# Patient Record
Sex: Female | Born: 1965 | Race: Black or African American | Hispanic: No
Health system: Southern US, Community
[De-identification: ages and names within clinical notes are randomized; demographics above are authoritative.]

## PROBLEM LIST (undated history)

## (undated) ENCOUNTER — Emergency Department (HOSPITAL_COMMUNITY): Discharge status: Absent without leave | Payer: Self-pay

---

## 1998-09-25 ENCOUNTER — Emergency Department (HOSPITAL_COMMUNITY): Admission: EM | Admit: 1998-09-25 | Discharge: 1998-09-25 | Payer: Self-pay | Admitting: Emergency Medicine

## 1998-10-02 ENCOUNTER — Inpatient Hospital Stay (HOSPITAL_COMMUNITY): Admission: EM | Admit: 1998-10-02 | Discharge: 1998-10-04 | Payer: Self-pay | Admitting: Emergency Medicine

## 2000-02-05 ENCOUNTER — Emergency Department (HOSPITAL_COMMUNITY): Admission: EM | Admit: 2000-02-05 | Discharge: 2000-02-05 | Payer: Self-pay | Admitting: Emergency Medicine

## 2000-02-05 ENCOUNTER — Encounter: Payer: Self-pay | Admitting: Emergency Medicine

## 2001-08-06 ENCOUNTER — Emergency Department (HOSPITAL_COMMUNITY): Admission: EM | Admit: 2001-08-06 | Discharge: 2001-08-06 | Payer: Self-pay | Admitting: Emergency Medicine

## 2002-03-28 ENCOUNTER — Inpatient Hospital Stay (HOSPITAL_COMMUNITY): Admission: AD | Admit: 2002-03-28 | Discharge: 2002-03-31 | Payer: Self-pay | Admitting: *Deleted

## 2002-03-28 ENCOUNTER — Encounter (INDEPENDENT_AMBULATORY_CARE_PROVIDER_SITE_OTHER): Payer: Self-pay

## 2002-03-29 ENCOUNTER — Encounter: Payer: Self-pay | Admitting: Obstetrics and Gynecology

## 2003-03-17 ENCOUNTER — Emergency Department (HOSPITAL_COMMUNITY): Admission: EM | Admit: 2003-03-17 | Discharge: 2003-03-17 | Payer: Self-pay | Admitting: Emergency Medicine

## 2003-03-22 ENCOUNTER — Encounter: Admission: RE | Admit: 2003-03-22 | Discharge: 2003-03-22 | Payer: Self-pay | Admitting: Internal Medicine

## 2003-04-25 ENCOUNTER — Emergency Department (HOSPITAL_COMMUNITY): Admission: EM | Admit: 2003-04-25 | Discharge: 2003-04-25 | Payer: Self-pay | Admitting: Emergency Medicine

## 2004-07-25 ENCOUNTER — Emergency Department (HOSPITAL_COMMUNITY): Admission: EM | Admit: 2004-07-25 | Discharge: 2004-07-25 | Payer: Self-pay | Admitting: Emergency Medicine

## 2005-03-15 ENCOUNTER — Emergency Department (HOSPITAL_COMMUNITY): Admission: EM | Admit: 2005-03-15 | Discharge: 2005-03-15 | Payer: Self-pay | Admitting: Family Medicine

## 2006-07-20 ENCOUNTER — Emergency Department (HOSPITAL_COMMUNITY): Admission: EM | Admit: 2006-07-20 | Discharge: 2006-07-20 | Payer: Self-pay | Admitting: Emergency Medicine

## 2007-10-29 ENCOUNTER — Emergency Department (HOSPITAL_COMMUNITY): Admission: EM | Admit: 2007-10-29 | Discharge: 2007-10-29 | Payer: Self-pay | Admitting: Emergency Medicine

## 2009-06-26 ENCOUNTER — Emergency Department (HOSPITAL_COMMUNITY): Admission: EM | Admit: 2009-06-26 | Discharge: 2009-06-26 | Payer: Self-pay | Admitting: Family Medicine

## 2009-07-14 ENCOUNTER — Ambulatory Visit: Payer: Self-pay | Admitting: Advanced Practice Midwife

## 2009-07-14 ENCOUNTER — Inpatient Hospital Stay (HOSPITAL_COMMUNITY): Admission: AD | Admit: 2009-07-14 | Discharge: 2009-07-15 | Payer: Self-pay | Admitting: Obstetrics and Gynecology

## 2009-10-22 ENCOUNTER — Emergency Department (HOSPITAL_COMMUNITY): Admission: EM | Admit: 2009-10-22 | Discharge: 2009-10-22 | Payer: Self-pay | Admitting: Emergency Medicine

## 2010-02-01 ENCOUNTER — Emergency Department (HOSPITAL_COMMUNITY)
Admission: EM | Admit: 2010-02-01 | Discharge: 2010-02-01 | Payer: Self-pay | Source: Home / Self Care | Admitting: Emergency Medicine

## 2010-04-14 LAB — POCT PREGNANCY, URINE: Preg Test, Ur: NEGATIVE

## 2010-04-14 LAB — URINE MICROSCOPIC-ADD ON

## 2010-04-14 LAB — CBC
HCT: 37.2 % (ref 36.0–46.0)
Hemoglobin: 12.6 g/dL (ref 12.0–15.0)
MCH: 30.5 pg (ref 26.0–34.0)
MCHC: 33.9 g/dL (ref 30.0–36.0)
MCV: 90.1 fL (ref 78.0–100.0)
Platelets: 176 10*3/uL (ref 150–400)
RBC: 4.13 MIL/uL (ref 3.87–5.11)
RDW: 12.2 % (ref 11.5–15.5)
WBC: 9.8 10*3/uL (ref 4.0–10.5)

## 2010-04-14 LAB — URINALYSIS, ROUTINE W REFLEX MICROSCOPIC
Bilirubin Urine: NEGATIVE
Glucose, UA: NEGATIVE mg/dL
Ketones, ur: 15 mg/dL — AB
Nitrite: NEGATIVE
Protein, ur: 100 mg/dL — AB
Specific Gravity, Urine: 1.014 (ref 1.005–1.030)
Urobilinogen, UA: 1 mg/dL (ref 0.0–1.0)
pH: 8 (ref 5.0–8.0)

## 2010-04-14 LAB — HEPATIC FUNCTION PANEL
ALT: 19 U/L (ref 0–35)
AST: 25 U/L (ref 0–37)
Albumin: 3.7 g/dL (ref 3.5–5.2)
Alkaline Phosphatase: 73 U/L (ref 39–117)
Bilirubin, Direct: 0.2 mg/dL (ref 0.0–0.3)
Indirect Bilirubin: 1.1 mg/dL — ABNORMAL HIGH (ref 0.3–0.9)
Total Bilirubin: 1.3 mg/dL — ABNORMAL HIGH (ref 0.3–1.2)
Total Protein: 6.8 g/dL (ref 6.0–8.3)

## 2010-04-14 LAB — BASIC METABOLIC PANEL
BUN: 8 mg/dL (ref 6–23)
CO2: 25 mEq/L (ref 19–32)
Calcium: 9.1 mg/dL (ref 8.4–10.5)
Chloride: 104 mEq/L (ref 96–112)
Creatinine, Ser: 0.95 mg/dL (ref 0.4–1.2)
GFR calc Af Amer: >60 mL/min (ref >60)
GFR calc non Af Amer: >60 mL/min (ref >60)
Glucose, Bld: 115 mg/dL — ABNORMAL HIGH (ref 70–99)
Potassium: 3.6 mEq/L (ref 3.5–5.1)
Sodium: 139 mEq/L (ref 135–145)

## 2010-04-14 LAB — DIFFERENTIAL
Basophils Absolute: 0 10*3/uL (ref 0.0–0.1)
Basophils Relative: 0 % (ref 0–1)
Eosinophils Absolute: 0 10*3/uL (ref 0.0–0.7)
Eosinophils Relative: 0 % (ref 0–5)
Lymphocytes Relative: 5 % — ABNORMAL LOW (ref 12–46)
Lymphs Abs: 0.5 10*3/uL — ABNORMAL LOW (ref 0.7–4.0)
Monocytes Absolute: 0.9 10*3/uL (ref 0.1–1.0)
Monocytes Relative: 9 % (ref 3–12)
Neutro Abs: 8.4 10*3/uL — ABNORMAL HIGH (ref 1.7–7.7)
Neutrophils Relative %: 86 % — ABNORMAL HIGH (ref 43–77)

## 2010-04-14 LAB — ETHANOL: Alcohol, Ethyl (B): <5 mg/dL (ref 0–10)

## 2010-04-14 LAB — GC/CHLAMYDIA PROBE AMP, GENITAL
Chlamydia, DNA Probe: NEGATIVE
GC Probe Amp, Genital: NEGATIVE

## 2010-04-14 LAB — WET PREP, GENITAL
Clue Cells Wet Prep HPF POC: NONE SEEN
Trich, Wet Prep: NONE SEEN
WBC, Wet Prep HPF POC: NONE SEEN
Yeast Wet Prep HPF POC: NONE SEEN

## 2010-04-14 LAB — RAPID URINE DRUG SCREEN, HOSP PERFORMED
Amphetamines: NOT DETECTED
Barbiturates: NOT DETECTED
Benzodiazepines: NOT DETECTED
Cocaine: POSITIVE — AB
Opiates: NOT DETECTED
Tetrahydrocannabinol: POSITIVE — AB

## 2010-04-14 LAB — LIPASE, BLOOD: Lipase: 18 U/L (ref 11–59)

## 2010-04-14 LAB — LACTIC ACID, PLASMA: Lactic Acid, Venous: 0.8 mmol/L (ref 0.5–2.2)

## 2010-04-21 LAB — WET PREP, GENITAL
Trich, Wet Prep: NONE SEEN
Yeast Wet Prep HPF POC: NONE SEEN

## 2010-04-21 LAB — URINALYSIS, ROUTINE W REFLEX MICROSCOPIC
Bilirubin Urine: NEGATIVE
Ketones, ur: NEGATIVE mg/dL
Nitrite: NEGATIVE
Protein, ur: NEGATIVE mg/dL
Specific Gravity, Urine: 1.02 (ref 1.005–1.030)
Urobilinogen, UA: 0.2 mg/dL (ref 0.0–1.0)

## 2010-04-21 LAB — RAPID URINE DRUG SCREEN, HOSP PERFORMED
Amphetamines: NOT DETECTED
Opiates: NOT DETECTED
Tetrahydrocannabinol: NOT DETECTED

## 2010-04-21 LAB — HCG, QUANTITATIVE, PREGNANCY: hCG, Beta Chain, Quant, S: 12148 m[IU]/mL — ABNORMAL HIGH (ref <5)

## 2010-04-21 LAB — GC/CHLAMYDIA PROBE AMP, GENITAL
Chlamydia, DNA Probe: NEGATIVE
GC Probe Amp, Genital: NEGATIVE

## 2010-04-21 LAB — URINE MICROSCOPIC-ADD ON

## 2010-04-21 LAB — ABO/RH: ABO/RH(D): O POS

## 2010-06-20 NOTE — Discharge Summary (Signed)
Kendra Barry, Kendra Barry                        ACCOUNT NO.:  [id number]   MEDICAL RECORD NO.:  [id number]                   PATIENT TYPE:  INP   LOCATION:  9104                                 FACILITY:  [facility]   PHYSICIAN:  Nani Gasser, M.D.            DATE OF BIRTH:  11/20/1965   DATE OF ADMISSION:  03/28/2002  DATE OF DISCHARGE:  03/31/2002                                 DISCHARGE SUMMARY   DISCHARGE DIAGNOSES:  1. Low transverse cesarean section.  2. Single term pregnancy.  3. Cocaine use.   DISCHARGE MEDICATIONS:  1. Percocet 5/325 1-2 p.o. q.4-6h. p.r.n.  2. Ibuprofen 600 mg 1 p.o. q.6h. p.r.n.  3. Ortho Evra Patch.  4. HCTZ 25 mg 1 p.o. daily.   DISCHARGE INSTRUCTIONS:  Per booklet, including the patient is to return to  [facility] in three weeks for a followup and have her blood pressure  checked and medications adjusted as needed.   HOSPITAL COURSE:  The patient is a 45 year old G3, P1-0-1-1 who presented  with unknown dates, with no prenatal care, who reports previously having a  term delivery in 1992, which required a cesarean section and ectopic in  2002.  The patient's blood pressure was elevated to 169/113 on admission.  On fetal heart tracing fetal heart revealed severe variable and early  decelerations with a late component, thus the patient was taken for a stat  cesarean section.  The patient had a girl with Apgar's of five at 1 minute  and eight at 5 minutes.  The patient was then transferred to [facility] because of  elevated blood pressures.  She required a total of 20 mg of labetalol in the  OR and an additional two boluses of 10 mg of labetalol to bring her  diastolic pressures back down to normal.  The patient was placed on HCTZ 25  mg 1 p.o. daily and labetalol 100 mg b.i.d. to help maintain normal blood  pressures.  The patient did well and stabilized.  The patient's urine drug  screen was positive for cocaine, HIV testing was negative,  hypertension was  stable at discharge and improved.  The infant was found to have a direct  hyperbilirubinemia and thus transferred to a NICU outside of the hospital  because of full capacity.  The patient is discharged with followup in three  weeks for her elevated blood pressures, in addition ADS will follow up with  her at home and social worker will be very closely involved with the patient  and child after discharge.  The patient is interested in rehab.                                                Nani Gasser, M.D.    CM/MEDQ  D:  03/31/2002  T:  03/31/2002  Job:  [id number]

## 2010-06-20 NOTE — Op Note (Signed)
   Kendra Barry, Kendra Barry                        ACCOUNT NO.:  [id number]   MEDICAL RECORD NO.:  [id number]                   PATIENT TYPE:  INP   LOCATION:  9104                                 [facility]ILITY:  [facility]   PHYSICIAN:  Conni Elliot, M.D.             DATE OF BIRTH:  Aug 21, 1965   DATE OF PROCEDURE:  03/28/2002  DATE OF DISCHARGE:  03/31/2002                                 OPERATIVE REPORT   PREOPERATIVE DIAGNOSES:  1. Repetitive late fetal heart decelerations.  2. Probable placental abruption.   POSTOPERATIVE DIAGNOSES:  1. Repetitive late fetal heart decelerations.  2. Probable placental abruption.   OPERATION:  Stat cesarean delivery.   OPERATOR:  Conni Elliot, M.D.   ANESTHESIA:  General endotracheal.   OPERATIVE FINDINGS:  A 5 pound 4 ounce female with Apgars of 5 and 8.  Cord  pH and placenta were sent.   OPERATIVE PROCEDURE:  The patient was brought in a supine to the operating  room with IV fluids running and receiving oxygen.  The patient was placed  under general orotracheal anesthesia and the abdomen having been prepped and  draped in a sterile fashion.  A low transverse Pfannenstiel incision was  made.  Incision made through skin and subcutaneous fascia.  Rectus muscles  separated in the midline.  Bladder flap created.  A low transverse uterine  incision was made.  Baby was delivered from the vertex presentation.  Cord  doubly clamped and cut and baby handed to neonatologists in attendance.  Placenta was delivered spontaneously.  The uterus, bladder flap, anterior  peritoneum, and fascia were closed in a __________ fashion.  Estimated blood  loss less than 800 mL.  Needles, sponge count correct.                                               Conni Elliot, M.D.    ASG/MEDQ  D:  04/25/2002  T:  04/25/2002  Job:  [id number]

## 2010-09-30 ENCOUNTER — Emergency Department (HOSPITAL_COMMUNITY)
Admission: EM | Admit: 2010-09-30 | Discharge: 2010-09-30 | Disposition: A | Discharge status: Home / Self Care | Payer: Self-pay | Attending: Emergency Medicine | Admitting: Emergency Medicine

## 2010-09-30 DIAGNOSIS — I10 Essential (primary) hypertension: Secondary | ICD-10-CM | POA: Insufficient documentation

## 2010-09-30 DIAGNOSIS — L03119 Cellulitis of unspecified part of limb: Secondary | ICD-10-CM | POA: Insufficient documentation

## 2010-09-30 DIAGNOSIS — L02419 Cutaneous abscess of limb, unspecified: Secondary | ICD-10-CM | POA: Insufficient documentation

## 2010-10-03 LAB — WOUND CULTURE: Gram Stain: NONE SEEN

## 2017-05-15 ENCOUNTER — Inpatient Hospital Stay: Payer: Self-pay

## 2017-05-15 ENCOUNTER — Encounter (HOSPITAL_COMMUNITY): Payer: Self-pay

## 2017-05-15 ENCOUNTER — Inpatient Hospital Stay (HOSPITAL_COMMUNITY): Payer: Medicaid Other

## 2017-05-15 ENCOUNTER — Emergency Department (HOSPITAL_COMMUNITY): Payer: Medicaid Other

## 2017-05-15 ENCOUNTER — Inpatient Hospital Stay (HOSPITAL_COMMUNITY)
Admission: EM | Admit: 2017-05-15 | Discharge: 2017-06-02 | DRG: 023 | Disposition: E | Payer: Medicaid Other | Attending: Neurology | Admitting: Neurology

## 2017-05-15 DIAGNOSIS — R402433 Glasgow coma scale score 3-8, at hospital admission: Secondary | ICD-10-CM | POA: Diagnosis present

## 2017-05-15 DIAGNOSIS — Z781 Physical restraint status: Secondary | ICD-10-CM

## 2017-05-15 DIAGNOSIS — J9601 Acute respiratory failure with hypoxia: Secondary | ICD-10-CM | POA: Diagnosis not present

## 2017-05-15 DIAGNOSIS — G9349 Other encephalopathy: Secondary | ICD-10-CM | POA: Diagnosis present

## 2017-05-15 DIAGNOSIS — R414 Neurologic neglect syndrome: Secondary | ICD-10-CM | POA: Diagnosis present

## 2017-05-15 DIAGNOSIS — G936 Cerebral edema: Secondary | ICD-10-CM | POA: Diagnosis present

## 2017-05-15 DIAGNOSIS — I161 Hypertensive emergency: Secondary | ICD-10-CM | POA: Diagnosis present

## 2017-05-15 DIAGNOSIS — R739 Hyperglycemia, unspecified: Secondary | ICD-10-CM | POA: Diagnosis present

## 2017-05-15 DIAGNOSIS — Z66 Do not resuscitate: Secondary | ICD-10-CM | POA: Diagnosis not present

## 2017-05-15 DIAGNOSIS — Z9911 Dependence on respirator [ventilator] status: Secondary | ICD-10-CM

## 2017-05-15 DIAGNOSIS — G935 Compression of brain: Secondary | ICD-10-CM | POA: Diagnosis not present

## 2017-05-15 DIAGNOSIS — G911 Obstructive hydrocephalus: Secondary | ICD-10-CM | POA: Diagnosis present

## 2017-05-15 DIAGNOSIS — J9690 Respiratory failure, unspecified, unspecified whether with hypoxia or hypercapnia: Secondary | ICD-10-CM

## 2017-05-15 DIAGNOSIS — I619 Nontraumatic intracerebral hemorrhage, unspecified: Secondary | ICD-10-CM | POA: Diagnosis present

## 2017-05-15 DIAGNOSIS — G8194 Hemiplegia, unspecified affecting left nondominant side: Secondary | ICD-10-CM | POA: Diagnosis present

## 2017-05-15 DIAGNOSIS — Z515 Encounter for palliative care: Secondary | ICD-10-CM | POA: Diagnosis not present

## 2017-05-15 DIAGNOSIS — Z0189 Encounter for other specified special examinations: Secondary | ICD-10-CM

## 2017-05-15 DIAGNOSIS — N179 Acute kidney failure, unspecified: Secondary | ICD-10-CM | POA: Diagnosis not present

## 2017-05-15 DIAGNOSIS — Z590 Homelessness: Secondary | ICD-10-CM

## 2017-05-15 DIAGNOSIS — R000 Tachycardia, unspecified: Secondary | ICD-10-CM | POA: Diagnosis present

## 2017-05-15 DIAGNOSIS — T405X1A Poisoning by cocaine, accidental (unintentional), initial encounter: Secondary | ICD-10-CM | POA: Diagnosis present

## 2017-05-15 DIAGNOSIS — E876 Hypokalemia: Secondary | ICD-10-CM | POA: Diagnosis present

## 2017-05-15 DIAGNOSIS — F1410 Cocaine abuse, uncomplicated: Secondary | ICD-10-CM | POA: Diagnosis present

## 2017-05-15 DIAGNOSIS — I610 Nontraumatic intracerebral hemorrhage in hemisphere, subcortical: Secondary | ICD-10-CM | POA: Diagnosis present

## 2017-05-15 DIAGNOSIS — Z9289 Personal history of other medical treatment: Secondary | ICD-10-CM

## 2017-05-15 DIAGNOSIS — I615 Nontraumatic intracerebral hemorrhage, intraventricular: Secondary | ICD-10-CM | POA: Diagnosis present

## 2017-05-15 DIAGNOSIS — Z6826 Body mass index (BMI) 26.0-26.9, adult: Secondary | ICD-10-CM

## 2017-05-15 DIAGNOSIS — Z01818 Encounter for other preprocedural examination: Secondary | ICD-10-CM

## 2017-05-15 DIAGNOSIS — R64 Cachexia: Secondary | ICD-10-CM | POA: Diagnosis present

## 2017-05-15 DIAGNOSIS — R40243 Glasgow coma scale score 3-8, unspecified time: Secondary | ICD-10-CM

## 2017-05-15 DIAGNOSIS — J9600 Acute respiratory failure, unspecified whether with hypoxia or hypercapnia: Secondary | ICD-10-CM

## 2017-05-15 DIAGNOSIS — R4701 Aphasia: Secondary | ICD-10-CM | POA: Diagnosis present

## 2017-05-15 DIAGNOSIS — I10 Essential (primary) hypertension: Secondary | ICD-10-CM | POA: Diagnosis present

## 2017-05-15 DIAGNOSIS — R509 Fever, unspecified: Secondary | ICD-10-CM

## 2017-05-15 DIAGNOSIS — R29810 Facial weakness: Secondary | ICD-10-CM | POA: Diagnosis present

## 2017-05-15 LAB — ETHANOL: Alcohol, Ethyl (B): <10 mg/dL (ref <10)

## 2017-05-15 LAB — URINALYSIS, ROUTINE W REFLEX MICROSCOPIC
BACTERIA UA: NONE SEEN
BILIRUBIN URINE: NEGATIVE
Glucose, UA: 50 mg/dL — AB
KETONES UR: NEGATIVE mg/dL
Leukocytes, UA: NEGATIVE
Nitrite: NEGATIVE
PROTEIN: 30 mg/dL — AB
Specific Gravity, Urine: 1.01 (ref 1.005–1.030)
Squamous Epithelial / LPF: NONE SEEN
pH: 7 (ref 5.0–8.0)

## 2017-05-15 LAB — CBC
HEMATOCRIT: 42.6 % (ref 36.0–46.0)
HEMOGLOBIN: 14.2 g/dL (ref 12.0–15.0)
MCH: 29.3 pg (ref 26.0–34.0)
MCHC: 33.3 g/dL (ref 30.0–36.0)
MCV: 88 fL (ref 78.0–100.0)
Platelets: 194 10*3/uL (ref 150–400)
RBC: 4.84 MIL/uL (ref 3.87–5.11)
RDW: 12.5 % (ref 11.5–15.5)
WBC: 6.4 10*3/uL (ref 4.0–10.5)

## 2017-05-15 LAB — I-STAT BETA HCG BLOOD, ED (MC, WL, AP ONLY): I-stat hCG, quantitative: <5 m[IU]/mL (ref <5)

## 2017-05-15 LAB — COMPREHENSIVE METABOLIC PANEL
ALBUMIN: 4 g/dL (ref 3.5–5.0)
ALT: 13 U/L — AB (ref 14–54)
AST: 22 U/L (ref 15–41)
Alkaline Phosphatase: 87 U/L (ref 38–126)
Anion gap: 9 (ref 5–15)
BILIRUBIN TOTAL: 1.1 mg/dL (ref 0.3–1.2)
BUN: 10 mg/dL (ref 6–20)
CHLORIDE: 103 mmol/L (ref 101–111)
CO2: 26 mmol/L (ref 22–32)
CREATININE: 1.08 mg/dL — AB (ref 0.44–1.00)
Calcium: 9.4 mg/dL (ref 8.9–10.3)
GFR calc Af Amer: >60 mL/min (ref >60)
GFR calc non Af Amer: 59 mL/min — ABNORMAL LOW (ref >60)
GLUCOSE: 180 mg/dL — AB (ref 65–99)
POTASSIUM: 2.8 mmol/L — AB (ref 3.5–5.1)
Sodium: 138 mmol/L (ref 135–145)
TOTAL PROTEIN: 7.6 g/dL (ref 6.5–8.1)

## 2017-05-15 LAB — DIFFERENTIAL
Basophils Absolute: 0 10*3/uL (ref 0.0–0.1)
Basophils Relative: 0 %
Eosinophils Absolute: 0.2 10*3/uL (ref 0.0–0.7)
Eosinophils Relative: 3 %
LYMPHS ABS: 2.8 10*3/uL (ref 0.7–4.0)
Lymphocytes Relative: 44 %
MONOS PCT: 7 %
Monocytes Absolute: 0.4 10*3/uL (ref 0.1–1.0)
NEUTROS ABS: 2.9 10*3/uL (ref 1.7–7.7)
NEUTROS PCT: 46 %

## 2017-05-15 LAB — URINALYSIS, COMPLETE (UACMP) WITH MICROSCOPIC
BILIRUBIN URINE: NEGATIVE
Bacteria, UA: NONE SEEN
Glucose, UA: 50 mg/dL — AB
KETONES UR: NEGATIVE mg/dL
Leukocytes, UA: NEGATIVE
Nitrite: NEGATIVE
PROTEIN: 30 mg/dL — AB
Specific Gravity, Urine: 1.011 (ref 1.005–1.030)
pH: 7 (ref 5.0–8.0)

## 2017-05-15 LAB — RAPID URINE DRUG SCREEN, HOSP PERFORMED
Amphetamines: NOT DETECTED
Barbiturates: NOT DETECTED
Benzodiazepines: NOT DETECTED
Cocaine: POSITIVE — AB
OPIATES: NOT DETECTED
Tetrahydrocannabinol: NOT DETECTED

## 2017-05-15 LAB — PROTIME-INR
INR: 1.02
Prothrombin Time: 13.3 seconds (ref 11.4–15.2)

## 2017-05-15 LAB — I-STAT CHEM 8, ED
BUN: 12 mg/dL (ref 6–20)
CREATININE: 0.9 mg/dL (ref 0.44–1.00)
Calcium, Ion: 1.19 mmol/L (ref 1.15–1.40)
Chloride: 102 mmol/L (ref 101–111)
GLUCOSE: 172 mg/dL — AB (ref 65–99)
HCT: 44 % (ref 36.0–46.0)
Hemoglobin: 15 g/dL (ref 12.0–15.0)
POTASSIUM: 2.8 mmol/L — AB (ref 3.5–5.1)
Sodium: 141 mmol/L (ref 135–145)
TCO2: 27 mmol/L (ref 22–32)

## 2017-05-15 LAB — MRSA PCR SCREENING: MRSA BY PCR: NEGATIVE

## 2017-05-15 LAB — APTT: aPTT: 25 seconds (ref 24–36)

## 2017-05-15 LAB — I-STAT TROPONIN, ED: TROPONIN I, POC: 0.01 ng/mL (ref 0.00–0.08)

## 2017-05-15 LAB — SODIUM
Sodium: 138 mmol/L (ref 135–145)
Sodium: 145 mmol/L (ref 135–145)

## 2017-05-15 LAB — HEMOGLOBIN A1C
Hgb A1c MFr Bld: 5.4 % (ref 4.8–5.6)
Mean Plasma Glucose: 108.28 mg/dL

## 2017-05-15 MED ORDER — NICARDIPINE HCL IN NACL 20-0.86 MG/200ML-% IV SOLN
0.0000 mg/h | INTRAVENOUS | Status: DC
  Administered 2017-05-15 (×2): 15 mg/h via INTRAVENOUS
  Filled 2017-05-15: qty 200

## 2017-05-15 MED ORDER — CHLORHEXIDINE GLUCONATE CLOTH 2 % EX PADS
6.0000 | MEDICATED_PAD | Freq: Every day | CUTANEOUS | Status: DC
  Administered 2017-05-15 – 2017-05-21 (×4): 6 via TOPICAL

## 2017-05-15 MED ORDER — CLEVIDIPINE BUTYRATE 0.5 MG/ML IV EMUL
0.0000 mg/h | INTRAVENOUS | Status: DC
Start: 2017-05-15 — End: 2017-05-22
  Administered 2017-05-15: 8 mg/h via INTRAVENOUS
  Administered 2017-05-15 – 2017-05-17 (×19): 21 mg/h via INTRAVENOUS
  Administered 2017-05-18: 13 mg/h via INTRAVENOUS
  Administered 2017-05-18: 21 mg/h via INTRAVENOUS
  Administered 2017-05-18: 19 mg/h via INTRAVENOUS
  Administered 2017-05-18: 21 mg/h via INTRAVENOUS
  Administered 2017-05-19: 10 mg/h via INTRAVENOUS
  Administered 2017-05-19: 12 mg/h via INTRAVENOUS
  Administered 2017-05-19 (×2): 10 mg/h via INTRAVENOUS
  Administered 2017-05-19: 2 mg/h via INTRAVENOUS
  Administered 2017-05-20 (×2): 12 mg/h via INTRAVENOUS
  Administered 2017-05-20 (×2): 15 mg/h via INTRAVENOUS
  Administered 2017-05-21: 10 mg/h via INTRAVENOUS
  Administered 2017-05-21 (×2): 12 mg/h via INTRAVENOUS
  Filled 2017-05-15 (×9): qty 50
  Filled 2017-05-15: qty 100
  Filled 2017-05-15 (×9): qty 50
  Filled 2017-05-15: qty 100
  Filled 2017-05-15 (×12): qty 50
  Filled 2017-05-15: qty 100
  Filled 2017-05-15 (×5): qty 50

## 2017-05-15 MED ORDER — LABETALOL HCL 5 MG/ML IV SOLN
10.0000 mg | INTRAVENOUS | Status: DC | PRN
  Administered 2017-05-15 – 2017-05-17 (×11): 10 mg via INTRAVENOUS
  Filled 2017-05-15 (×12): qty 4

## 2017-05-15 MED ORDER — NICARDIPINE HCL IN NACL 20-0.86 MG/200ML-% IV SOLN
INTRAVENOUS | Status: AC
  Filled 2017-05-15: qty 200

## 2017-05-15 MED ORDER — ACETAMINOPHEN 325 MG PO TABS: 650.0000 mg | ORAL_TABLET | ORAL | Status: DC | PRN

## 2017-05-15 MED ORDER — POTASSIUM CHLORIDE 10 MEQ/100ML IV SOLN
10.0000 meq | INTRAVENOUS | Status: AC
  Administered 2017-05-15 (×3): 10 meq via INTRAVENOUS
  Filled 2017-05-15 (×4): qty 100

## 2017-05-15 MED ORDER — LABETALOL HCL 5 MG/ML IV SOLN
10.0000 mg | Freq: Once | INTRAVENOUS | Status: AC
  Administered 2017-05-15: 10 mg via INTRAVENOUS

## 2017-05-15 MED ORDER — ACETAMINOPHEN 160 MG/5ML PO SOLN
650.0000 mg | ORAL | Status: DC | PRN
Start: 2017-05-15 — End: 2017-05-22
  Administered 2017-05-17 – 2017-05-18 (×3): 650 mg
  Filled 2017-05-15 (×3): qty 20.3

## 2017-05-15 MED ORDER — NICARDIPINE HCL IN NACL 20-0.86 MG/200ML-% IV SOLN
3.0000 mg/h | INTRAVENOUS | Status: DC
  Administered 2017-05-15: 5 mg/h via INTRAVENOUS

## 2017-05-15 MED ORDER — PROPOFOL 1000 MG/100ML IV EMUL
INTRAVENOUS | Status: AC
  Administered 2017-05-16: 40 ug/kg/min
  Filled 2017-05-15: qty 100

## 2017-05-15 MED ORDER — LABETALOL HCL 5 MG/ML IV SOLN
10.0000 mg | Freq: Once | INTRAVENOUS | Status: AC
  Administered 2017-05-15: 10 mg via INTRAVENOUS
  Filled 2017-05-15: qty 4

## 2017-05-15 MED ORDER — STROKE: EARLY STAGES OF RECOVERY BOOK
Freq: Once | Status: DC
  Filled 2017-05-15: qty 1

## 2017-05-15 MED ORDER — ACETAMINOPHEN 650 MG RE SUPP: 650.0000 mg | RECTAL | Status: DC | PRN

## 2017-05-15 MED ORDER — PANTOPRAZOLE SODIUM 40 MG IV SOLR
40.0000 mg | Freq: Every day | INTRAVENOUS | Status: DC
  Administered 2017-05-16: 40 mg via INTRAVENOUS
  Filled 2017-05-15 (×2): qty 40

## 2017-05-15 MED ORDER — LABETALOL HCL 5 MG/ML IV SOLN
20.0000 mg | Freq: Once | INTRAVENOUS | Status: AC
  Administered 2017-05-15: 20 mg via INTRAVENOUS
  Filled 2017-05-15: qty 4

## 2017-05-15 MED ORDER — SODIUM CHLORIDE 3 % IV SOLN
INTRAVENOUS | Status: AC
  Administered 2017-05-15: 50 mL/h via INTRAVENOUS
  Administered 2017-05-15 – 2017-05-16 (×2): 75 mL/h via INTRAVENOUS
  Filled 2017-05-15 (×5): qty 500

## 2017-05-15 MED ORDER — LIDOCAINE HCL (PF) 1 % IJ SOLN
INTRAMUSCULAR | Status: AC
  Administered 2017-05-16: 5 mL
  Filled 2017-05-15: qty 5

## 2017-05-15 MED ORDER — SODIUM CHLORIDE 0.9% FLUSH
10.0000 mL | INTRAVENOUS | Status: DC | PRN
  Administered 2017-05-17: 10 mL
  Filled 2017-05-15: qty 40

## 2017-05-15 MED ORDER — SENNOSIDES-DOCUSATE SODIUM 8.6-50 MG PO TABS
1.0000 | ORAL_TABLET | Freq: Two times a day (BID) | ORAL | Status: DC
  Administered 2017-05-16 – 2017-05-21 (×11): 1 via ORAL
  Filled 2017-05-15 (×12): qty 1

## 2017-05-15 MED ORDER — NICARDIPINE HCL IN NACL 20-0.86 MG/200ML-% IV SOLN
INTRAVENOUS | Status: AC
  Administered 2017-05-15: 15 mg/h via INTRAVENOUS
  Filled 2017-05-15: qty 200

## 2017-05-15 MED ORDER — SODIUM CHLORIDE 23.4 % INJECTION (4 MEQ/ML) FOR IV ADMINISTRATION
120.0000 meq | Freq: Once | INTRAVENOUS | Status: AC
  Administered 2017-05-15: 120 meq via INTRAVENOUS
  Filled 2017-05-15: qty 30

## 2017-05-15 MED ORDER — SODIUM CHLORIDE 0.9% FLUSH
10.0000 mL | Freq: Two times a day (BID) | INTRAVENOUS | Status: DC
  Administered 2017-05-16: 30 mL
  Administered 2017-05-16: 10 mL
  Administered 2017-05-16: 20 mL
  Administered 2017-05-18 – 2017-05-20 (×4): 10 mL
  Administered 2017-05-20: 20 mL
  Administered 2017-05-21 (×2): 10 mL

## 2017-05-15 NOTE — ED Triage Notes (Signed)
Pt brought in by EMS due stroke like symptoms. LSN was 08:00 and pt was found by friend at 10:45. Pt was incontinent of urine and vomited. Pt does not follow commands. CBG was 150. Pt has seizure like activity with EMS enroute to hospital.

## 2017-05-15 NOTE — H&P (Addendum)
Admission H&P    Chief Complaint: AMS, left sided weakness and left facial droop  HPI: Kendra Barry is an 52 y.o. female who presents via EMS with left sided weakness. LKN was 0800 when she was seen to be walking in the house and behaving normally. She went to sleep and at 10:45 was noted by her friend to be confused and aphasic. EMS was called and noted left sided weakness, left facial droop, AMS and aphasia. She was incontinent of urine and vomited. She was not following commands. CBG was 150. EMS paged out that she had seizure like activity with enroute to hospital, but on further interview there was smooth, non-jerky flailing of her RUE without myoclonus, twitching, jerking or other seizure-like movements.    CT head revealed a large right basal ganglia acute hemorrhage with intraventricular extension.    History reviewed. No pertinent past medical history.  History reviewed. No pertinent surgical history.  No family history on file. Social History:  has no tobacco, alcohol, and drug history on file.  Allergies: Not on File   (Not in a hospital admission)  ROS: Unable to obtain due to AMS.   Physical Examination: Blood pressure (!) 152/84, pulse 80, temperature (!) 95.4 F (35.2 C), temperature source Rectal, resp. rate 17, height _0  (1.6 m), weight 65.2 kg (143 lb 11.8 oz), SpO2 98 %.  HEENT-  [place]/AT. Edentulous. Neck supple. Lungs - Respirations unlabored Extremities - No edema  Neurologic Examination: Mental Status: Obtunded. Not following commands. No verbal output. Not attending to visual stimuli. No purposeful spontaneous movements other than localization to pain.   Cranial Nerves: II:  No blink to threat. Pupils pinpoint.  III,IV, VI: Eyes dysconjugate with exotropia noted. No forced gaze deviation. Not tracking visual stimuli. Doll's eye reflex is suppressed. V,VII: Left facial droop with decreased response to left [place]ridge pressure relative to the right VIII: Not  responding to verbal commands.  IX,X: Unable to assess XI: No preferential rotation of head XII: Unable to assess Motor/Sensory: Moves RUE antigravity and to noxious with maximum elicitable strength 4-/5 during thrashing movements LUE: 0/5 except for briefly lifting forearm above bed 1x RLE: Withdraws and elevates above bed briskly with thrashing movements, 4/5 LLE: 2/5 movement to noxious Deep Tendon Reflexes:  2+ bilateral brachioradialis 4+ patellae bilaterally 3+ achilles bilaterally Toes upgoing bilaterally Cerebellar/Gait: Unable to assess  Results for orders placed or performed during the hospital encounter of 05/30/2017 (from the past 48 hour(s))  Protime-INR     Status: None   Collection Time: 05/06/2017 11:20 AM  Result Value Ref Range   Prothrombin Time 13.3 11.4 - 15.2 seconds   INR 1.02     Comment: Performed at [facility], [place] [address] [place], [place] [address]  APTT     Status: None   Collection Time: 05/14/2017 11:20 AM  Result Value Ref Range   aPTT 25 24 - 36 seconds    Comment: Performed at [facility] [address], [place] [address]  CBC     Status: None   Collection Time: 05/19/2017 11:20 AM  Result Value Ref Range   WBC 6.4 4.0 - 10.5 K/uL   RBC 4.84 3.87 - 5.11 MIL/uL   Hemoglobin 14.2 12.0 - 15.0 g/dL   HCT 42.6 36.0 - 46.0 %   MCV 88.0 78.0 - 100.0 fL   MCH 29.3 26.0 - 34.0 pg   MCHC 33.3 30.0 - 36.0 g/dL   RDW  12.5 11.5 - 15.5 %   Platelets 194 150 - 400 K/uL    Comment: Performed at [facility], [place] [address] [place], [place] [address]  Differential     Status: None   Collection Time: 05/19/2017 11:20 AM  Result Value Ref Range   Neutrophils Relative % 46 %   Neutro Abs 2.9 1.7 - 7.7 K/uL   Lymphocytes Relative 44 %   Lymphs Abs 2.8 0.7 - 4.0 K/uL   Monocytes Relative 7 %   Monocytes Absolute 0.4 0.1 - 1.0 K/uL   Eosinophils Relative 3 %   Eosinophils Absolute 0.2 0.0 - 0.7 K/uL   Basophils Relative 0 %    Basophils Absolute 0.0 0.0 - 0.1 K/uL    Comment: Performed at [facility], [place] [address], [place], [place] [address]  Comprehensive metabolic panel     Status: Abnormal   Collection Time: 05/16/2017 11:20 AM  Result Value Ref Range   Sodium 138 135 - 145 mmol/L   Potassium 2.8 (L) 3.5 - 5.1 mmol/L   Chloride 103 101 - 111 mmol/L   CO2 26 22 - 32 mmol/L   Glucose, Bld 180 (H) 65 - 99 mg/dL   BUN 10 6 - 20 mg/dL   Creatinine, Ser 1.08 (H) 0.44 - 1.00 mg/dL   Calcium 9.4 8.9 - 10.3 mg/dL   Total Protein 7.6 6.5 - 8.1 g/dL   Albumin 4.0 3.5 - 5.0 g/dL   AST 22 15 - 41 U/L   ALT 13 (L) 14 - 54 U/L   Alkaline Phosphatase 87 38 - 126 U/L   Total Bilirubin 1.1 0.3 - 1.2 mg/dL   GFR calc non Af Amer 59 (L) >60 mL/min   GFR calc Af Amer >60 >60 mL/min    Comment: (NOTE) The eGFR has been calculated using the CKD EPI equation. This calculation has not been validated in all clinical situations. eGFR's persistently <60 mL/min signify possible Chronic Kidney Disease.    Anion gap 9 5 - 15    Comment: Performed at [facility] [address] [place], [place] [address]  I-stat troponin, ED     Status: None   Collection Time: 05/28/2017 11:28 AM  Result Value Ref Range   Troponin i, poc 0.01 0.00 - 0.08 ng/mL   Comment 3            Comment: Due to the release kinetics of cTnI, a negative result within the first hours of the onset of symptoms does not rule out myocardial infarction with certainty. If myocardial infarction is still suspected, repeat the test at appropriate intervals.   I-Stat Chem 8, ED     Status: Abnormal   Collection Time: 05/29/2017 11:29 AM  Result Value Ref Range   Sodium 141 135 - 145 mmol/L   Potassium 2.8 (L) 3.5 - 5.1 mmol/L   Chloride 102 101 - 111 mmol/L   BUN 12 6 - 20 mg/dL   Creatinine, Ser 0.90 0.44 - 1.00 mg/dL   Glucose, Bld 172 (H) 65 - 99 mg/dL   Calcium, Ion 1.19 1.15 - 1.40 mmol/L   TCO2 27 22 - 32 mmol/L   Hemoglobin 15.0 12.0 - 15.0  g/dL   HCT 44.0 36.0 - 46.0 %  I-Stat beta hCG blood, ED     Status: None   Collection Time: 05/26/2017 11:29 AM  Result Value Ref Range   I-stat hCG, quantitative <5.0 <5 mIU/mL   Comment 3  Comment:   GEST. AGE      CONC.  (mIU/mL)   <=1 WEEK        5 - 50     2 WEEKS       50 - 500     3 WEEKS       100 - 10,000     4 WEEKS     1,000 - 30,000        FEMALE AND NON-PREGNANT FEMALE:     LESS THAN 5 mIU/mL    Ct Head Code Stroke Wo Contrast  Result Date: 05/04/2017 CLINICAL DATA:  Code stroke. 52 year old female with altered mental status and incontinence. Last seen normal 0800 hours. EXAM: CT HEAD WITHOUT CONTRAST TECHNIQUE: Contiguous axial images were obtained from the base of the skull through the vertex without intravenous contrast. COMPARISON:  None. FINDINGS: Brain: Acute intracranial hemorrhage. There is intra-axial hyperdense hemorrhage centered at the right deep gray matter nuclei, perhaps originating at the right thalamus with extension into the ventricular system. The intra-axial component of blood encompasses 35 x 40 x 37 millimeters for an estimated blood volume of 26 milliliters. There is surrounding edema with regional mass effect, including midline shift of 8 millimeters near the site of the hemorrhage. There is a moderate volume of intraventricular extension of blood, present in the right lateral ventricle, the 3rd and 4th ventricles. Questionable mild lateral ventriculomegaly at this time. The temporal horns remain diminutive though. Confluent bilateral cerebral white matter hypodensity including involvement of the deep white matter capsules. There is a small chronic appearing infarct in the left cerebellar hemisphere. No other cortical encephalomalacia identified. Basilar cisterns remain patent. No superimposed acute cortically based infarct. Vascular: No suspicious intracranial vascular hyperdensity. Skull: No acute osseous abnormality identified. Hyperostosis, probably  a normal variant. Sinuses/Orbits: Clear. Other: Disconjugate gaze. Otherwise negative orbit and scalp soft tissues. ASPECTS [facility] Stroke Program Early CT Score) Total score (0-10 with 10 being normal): Not applicable, acute hemorrhage. IMPRESSION: 1. Acute intracranial hemorrhage. Right deep gray matter/thalamic intra-axial hematoma estimated at 26 mL. Surrounding edema and regional mass effect including 8 millimeters of leftward midline shift. 2. Moderate volume of intraventricular extension of hemorrhage. Questionable mild subsequent ventriculomegaly. 3. 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 Small chronic left cerebellar infarct. Preliminary results were communicated to Dr. Cheral Marker at 11:47 amon 04/12/2019by text page via the [facility] messaging system. Electronically Signed   By: Genevie Ann M.D.   On: 05/14/2017 11:48    Assessment: 52 y.o. 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 Malignant hypertension on admission. May be response to hemorrhage but sympathomimetic drug abuse effects (from cocaine or methamphetamine) is also on the DDx.  2. CT head: Acute intracranial hemorrhage. Right deep gray matter/thalamic intra-axial hematoma estimated at 26 mL. Surrounding edema and regional mass effect including 8 millimeters of leftward midline shift. There is moderate volume of intraventricular extension of hemorrhage. Questionable mild subsequent ventriculomegaly. Also seen is evidence of advanced underlying chronic small vessel disease and a small chronic left cerebellar infarct. 3. Hypokalemia  Plan: 1. Admit to ICU under Neurology service 2. MRI/MRA of head when stable 3. Carotid ultrasound 4. TTE 5. PT consult, OT consult, Speech consult 6. Cardiac telemetry 7. Frequent neuro checks 8. Hypertonic saline 3% at 50 cc/hr 9. BP management with nicardipine drip and PRN labetalol IV 10. No antiplatelet medications or anticoagulants.  DVT prophylaxis with SCDs 11. Telemetry monitoring 12. Potassium replacement 13.  Tox screen for possible drugs of abuse 14. Repeat CT head at 10 PM 15. Neurosurgery consulted by EDP. May need ventriculostomy placement if repeat CT head shows developing hydrocephalus.   60 minutes spent in the emergent neurological evaluation and management of this critically ill patient  Electronically signed: Dr. Kerney Elbe 05/04/2017, 12:30 PM

## 2017-05-15 NOTE — Progress Notes (Signed)
Neuro MD made aware of Na jump. 23% given. MD agrees it is an expected finding. To continue 3% at current rate.

## 2017-05-15 NOTE — ED Provider Notes (Addendum)
[facility] EMERGENCY DEPARTMENT Provider Note   CSN: [id number] Arrival date & time: 05/19/2017  1121     History   Chief Complaint No chief complaint on file.   HPI Kendra Barry is a 52 y.o. female.  HPI  Seen and evaluated on arrival to the emergency department. Patient arrives as a code stroke. EMS reports that the patient was last seen normal at 8 AM, 3 hours prior to ED arrival. Reportedly that that time the patient was awake, alert, amatory, with no complaints. History was provided by a female who is with the patient, on EMS arrival. He is not with them currently. They report that he said she was fine, but upon awakening from a nap, was altered. EMS reports that on their arrival the patient had left-sided neglect, hemiplegia, and speech difficulty. Patient was also substantially hypertensive, with a systolic greater than 260.  The patient herself cannot provide any details of the history, as she has noninteractive, drooling, combative.  Level 5 caveat secondary to patient's condition.  No past medical history on file.  There are no active problems to display for this patient.   History reviewed. No pertinent surgical history.   OB History   None      Home Medications    Prior to Admission medications   Not on File    Family History No family history on file.  Social History Social History   Tobacco Use  . Smoking status: Not on file  Substance Use Topics  . Alcohol use: Not on file  . Drug use: Not on file   History not able to be obtained secondary to the patient's clinical condition, absence of records in our system, absence of other individuals here with her.  Allergies   Patient has no allergy information on record.   Review of Systems Review of Systems  Unable to perform ROS: Mental status change     Physical Exam Updated Vital Signs There were no vitals taken for this visit.  Physical Exam  Constitutional: She  has a sickly appearance.  Frail sickly appearing female with uncontrolled movement, non-directable, resisting motion of the left side, nonverbal  HENT:  Head: Normocephalic and atraumatic.  Eyes: Conjunctivae and EOM are normal.  Cardiovascular: Normal rate and regular rhythm.  Pulmonary/Chest: Effort normal and breath sounds normal. No stridor. No respiratory distress.  Abdominal: She exhibits no distension.  Musculoskeletal: She exhibits no edema.  Neurological: She displays atrophy. A cranial nerve deficit is present. She exhibits abnormal muscle tone.  Patient follows commands only inconsistently, minimally. She moves the right side spontaneously, does not move the left side spontaneously, but does resist against range of motion exercise. Left-sided facial droop, no speech  Skin: Skin is warm and dry.  Psychiatric: Cognition and memory are impaired.  Nursing note and vitals reviewed.    ED Treatments / Results  Labs (all labs ordered are listed, but only abnormal results are displayed) Labs Reviewed  COMPREHENSIVE METABOLIC PANEL - Abnormal; Notable for the following components:      Result Value   Potassium 2.8 (*)    Glucose, Bld 180 (*)    Creatinine, Ser 1.08 (*)    ALT 13 (*)    GFR calc non Af Amer 59 (*)    All other components within normal limits  I-STAT CHEM 8, ED - Abnormal; Notable for the following components:   Potassium 2.8 (*)    Glucose, Bld 172 (*)    All  other components within normal limits  PROTIME-INR  APTT  CBC  DIFFERENTIAL  URINALYSIS, ROUTINE W REFLEX MICROSCOPIC  RAPID URINE DRUG SCREEN, HOSP PERFORMED  ETHANOL  I-STAT TROPONIN, ED  I-STAT BETA HCG BLOOD, ED ([facility], [facility], [facility] ONLY)  CBG MONITORING, ED    EKG EKG Interpretation  Date/Time:  Saturday May 15 2017 11:47:47 EDT Ventricular Rate:  71 PR Interval:    QRS Duration: 95 QT Interval:  428 QTC Calculation: 466 R Axis:   56 Text Interpretation:  Sinus rhythm LAE, consider  biatrial enlargement RSR' in V1 or V2, right VCD or RVH LVH with secondary repolarization abnormality Baseline wander in lead(s) II III aVF V6 T wave abnormality Artifact Confirmed by Gerhard MunchLockwood, Nyah Shepherd [phone]([id number]) on 05/30/2017 12:08:46 PM Also confirmed by Gerhard MunchLockwood, Bess Saltzman ([id number]), editor [phone], Marilyn [phone]([id number])  on 05/14/2017 12:28:50 PM   Radiology Ct Head Code Stroke Wo Contrast  Result Date: 05/16/2017 CLINICAL DATA:  Code stroke. 52 year old female with altered mental status and incontinence. Last seen normal 0800 hours. EXAM: CT HEAD WITHOUT CONTRAST TECHNIQUE: Contiguous axial images were obtained from the base of the skull through the vertex without intravenous contrast. COMPARISON:  None. FINDINGS: Brain: Acute intracranial hemorrhage. There is intra-axial hyperdense hemorrhage centered at the right deep gray matter nuclei, perhaps originating at the right thalamus with extension into the ventricular system. The intra-axial component of blood encompasses 35 x 40 x 37 millimeters for an estimated blood volume of 26 milliliters. There is surrounding edema with regional mass effect, including midline shift of 8 millimeters near the site of the hemorrhage. There is a moderate volume of intraventricular extension of blood, present in the right lateral ventricle, the 3rd and 4th ventricles. Questionable mild lateral ventriculomegaly at this time. The temporal horns remain diminutive though. Confluent bilateral cerebral white matter hypodensity including involvement of the deep white matter capsules. There is a small chronic appearing infarct in the left cerebellar hemisphere. No other cortical encephalomalacia identified. Basilar cisterns remain patent. No superimposed acute cortically based infarct. Vascular: No suspicious intracranial vascular hyperdensity. Skull: No acute osseous abnormality identified. Hyperostosis, probably a normal variant. Sinuses/Orbits: Clear. Other: Disconjugate gaze. Otherwise negative  orbit and scalp soft tissues. ASPECTS [facility]([place] Stroke Program Early CT Score) Total score (0-10 with 10 being normal): Not applicable, acute hemorrhage. IMPRESSION: 1. Acute intracranial hemorrhage. Right deep gray matter/thalamic intra-axial hematoma estimated at 26 mL. Surrounding edema and regional mass effect including 8 millimeters of leftward midline shift. 2. Moderate volume of intraventricular extension of hemorrhage. Questionable mild subsequent ventriculomegaly. 3. 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 Small chronic left cerebellar infarct. Preliminary results were communicated to Dr. Otelia LimesLindzen at 11:47 amon 04/14/2019by text page via [facility]. Electronically Signed   By: Odessa FlemingH  Hall M.D.   On: 05/09/2017 11:48    Procedures Procedures (including critical care time)  Medications Ordered in ED Medications  niCARdipine in saline (CARDENE-IV) 20-0.86 MG/200ML-% infusion SOLN (has no administration in time range)  nicardipine (CARDENE) 20mg  in 0.86% saline 200ml IV infusion (0.1 mg/ml) (15 mg/hr Intravenous Rate/Dose Change 05/26/2017 1159)  labetalol (NORMODYNE,TRANDATE) injection 10 mg (10 mg Intravenous Given 05/30/2017 1134)      Initial Impression / Assessment and Plan / ED Course  I have reviewed the triage vital signs and the nursing notes.  Pertinent labs & imaging results that were available during my care of the patient were reviewed by me and considered in my medical decision making (see chart for details).  Immediately after the initial evaluation patient had emergent head CT performed. I reviewed these images in the CT suite, concerning for acute hemorrhage. Patient remains hypertensive, and is starting on a Cardene drip, after return to the resuscitation bay. Patient's case discussed with our neurology team, who concurrently is evaluated the patient.   12:29 PM Blood pressure now 150 systolic, substantially decreased from arrival readings  of greater than 250 systolic. Patient remains in similar condition, though more calm than on arrival, she is a non-interactive.  she is not hypoxic, has no increased work of breathing, no indication for intubation currently. I discussed patient's case with our neurosurgery colleagues, no indication for emergent neurosurgical intervention.  This 53 year old female presents with acute change in cognition, is found to have left-sided deficit, facial droop, not interactivity, and is substantially hypertensive, all concerning for intracranial hemorrhage, which was demonstrated on CT scan. Patient required initiation of a Cardene drip for blood pressure control. After discussion with neurosurgery, and neurology the patient was admitted to the neuro logic ICU for further evaluation and management.   Final Clinical Impressions(s) / ED Diagnoses  Acute hemorrhagic stroke Hypertensive crisis  CRITICAL CARE Performed by: Gerhard Munch Total critical care time: 40 minutes Critical care time was exclusive of separately billable procedures and treating other patients. Critical care was necessary to treat or prevent imminent or life-threatening deterioration. Critical care was time spent personally by me on the following activities: development of treatment plan with patient and/or surrogate as well as nursing, discussions with consultants, evaluation of patient's response to treatment, examination of patient, obtaining history from patient or surrogate, ordering and performing treatments and interventions, ordering and review of laboratory studies, ordering and review of radiographic studies, pulse oximetry and re-evaluation of patient's condition.    Gerhard Munch, MD 05/16/2017 1230   2:01 PM Family members now present, they note that the patient has had difficulty with blood pressure issues for a long time, also acknowledges the patient smokes crack.  Blood pressure now well controlled with  Cardene, 138 systolic.   Gerhard Munch, MD 05/26/2017 1401

## 2017-05-15 NOTE — Progress Notes (Signed)
Peripherally Inserted Central Catheter/Midline Placement  The IV Nurse has discussed with the patient and/or persons authorized to consent for the patient, the purpose of this procedure and the potential benefits and risks involved with this procedure.  The benefits include less needle sticks, lab draws from the catheter, and the patient may be discharged home with the catheter. Risks include, but not limited to, infection, bleeding, blood clot (thrombus formation), and puncture of an artery; nerve damage and irregular heartbeat and possibility to perform a PICC exchange if needed/ordered by physician.  Alternatives to this procedure were also discussed.  Bard Power PICC patient education guide, fact sheet on infection prevention and patient information card has been provided to patient /or left at bedside.   No family at bedside or number in chart. PICC place per medical necessity. Pt unable to follow commands, nonverbal, pulls away/resistant from stimuli, no eye contact.  PICC/Midline Placement Documentation  PICC Double Lumen 2017-10-31 35 cm 0 cm (Active)  Indication for Insertion or Continuance of Line Vasoactive infusions Sep 15, 2017  4:07 PM  Exposed Catheter (cm) 0 cm Sep 15, 2017  4:07 PM  Site Assessment Clean;Dry;Intact Sep 15, 2017  4:07 PM  Lumen #1 Status Flushed;Saline locked;Blood return noted Sep 15, 2017  4:07 PM  Lumen #2 Status Flushed;Saline locked;Blood return noted Sep 15, 2017  4:07 PM  Dressing Type Transparent Sep 15, 2017  4:07 PM  Dressing Status Clean;Dry;Intact;Antimicrobial disc in place Sep 15, 2017  4:07 PM  Line Care Connections checked and tightened Sep 15, 2017  4:07 PM  Line Adjustment (NICU/IV Team Only) No Sep 15, 2017  4:07 PM  Dressing Intervention New dressing Sep 15, 2017  4:07 PM  Dressing Change Due 05/26/2017 Sep 15, 2017  4:07 PM       Elliot Dallyiggs, Vivaan Helseth Wright Sep 15, 2017, 4:08 PM

## 2017-05-15 NOTE — Progress Notes (Signed)
No family noted in chart.  No family present at bedside.  Unable to obtain consent.  Dr Otelia LimesLindzen notified and authorizes PICC to be placed per medical necessity.

## 2017-05-15 NOTE — Progress Notes (Signed)
SLP Cancellation Note  Patient Details Name: Kendra Barry MRN: [id number] DOB: 12/23/1965   Cancelled treatment:       Reason Eval/Treat Not Completed: Medical issues which prohibited therapy. RN reported pt not ready. Will reattempt next date.   Metro KungAmy K Oleksiak, MA, CCC-SLP 05/16/2017, 4:30 PM  [phone]

## 2017-05-15 NOTE — Progress Notes (Signed)
Trying to change out lines and change cardene to cleviprex and give 23%, now need another PIV, sticking pt, she became agitated and BP above goal while all this was happening  (4:30-5:00)ish. I have not left the room and AM titrating cleviprex up and doing all I can to bring BP down.

## 2017-05-16 ENCOUNTER — Inpatient Hospital Stay (HOSPITAL_COMMUNITY): Payer: Medicaid Other

## 2017-05-16 DIAGNOSIS — J9600 Acute respiratory failure, unspecified whether with hypoxia or hypercapnia: Secondary | ICD-10-CM

## 2017-05-16 DIAGNOSIS — G911 Obstructive hydrocephalus: Secondary | ICD-10-CM

## 2017-05-16 DIAGNOSIS — R40243 Glasgow coma scale score 3-8, unspecified time: Secondary | ICD-10-CM

## 2017-05-16 DIAGNOSIS — I161 Hypertensive emergency: Secondary | ICD-10-CM

## 2017-05-16 DIAGNOSIS — G936 Cerebral edema: Secondary | ICD-10-CM

## 2017-05-16 DIAGNOSIS — Z01818 Encounter for other preprocedural examination: Secondary | ICD-10-CM

## 2017-05-16 DIAGNOSIS — I615 Nontraumatic intracerebral hemorrhage, intraventricular: Secondary | ICD-10-CM

## 2017-05-16 DIAGNOSIS — I610 Nontraumatic intracerebral hemorrhage in hemisphere, subcortical: Principal | ICD-10-CM

## 2017-05-16 LAB — BLOOD GAS, ARTERIAL
Acid-base deficit: 2.7 mmol/L — ABNORMAL HIGH (ref 0.0–2.0)
Acid-base deficit: 3.7 mmol/L — ABNORMAL HIGH (ref 0.0–2.0)
BICARBONATE: 21.9 mmol/L (ref 20.0–28.0)
Bicarbonate: 20.5 mmol/L (ref 20.0–28.0)
DRAWN BY: 358491
Drawn by: 362771
FIO2: 0.4
FIO2: 100
LHR: 24 {breaths}/min
MECHVT: 420 mL
MECHVT: 420 mL
O2 SAT: 99.5 %
O2 Saturation: 99.1 %
PATIENT TEMPERATURE: 98.6
PATIENT TEMPERATURE: 98.6
PEEP: 5 cmH2O
PEEP: 5 cmH2O
PH ART: 7.388 (ref 7.350–7.450)
PO2 ART: 414 mmHg — AB (ref 83.0–108.0)
RATE: 24 resp/min
pCO2 arterial: 34.8 mmHg (ref 32.0–48.0)
pCO2 arterial: 39.9 mmHg (ref 32.0–48.0)
pH, Arterial: 7.358 (ref 7.350–7.450)
pO2, Arterial: 160 mmHg — ABNORMAL HIGH (ref 83.0–108.0)

## 2017-05-16 LAB — BASIC METABOLIC PANEL
ANION GAP: 7 (ref 5–15)
ANION GAP: 7 (ref 5–15)
Anion gap: 8 (ref 5–15)
BUN: 8 mg/dL (ref 6–20)
BUN: 6 mg/dL (ref 6–20)
BUN: <5 mg/dL — ABNORMAL LOW (ref 6–20)
BUN: 5 mg/dL — ABNORMAL LOW (ref 6–20)
CALCIUM: 8.7 mg/dL — AB (ref 8.9–10.3)
CALCIUM: 8.9 mg/dL (ref 8.9–10.3)
CALCIUM: 8.8 mg/dL — AB (ref 8.9–10.3)
CO2: 19 mmol/L — AB (ref 22–32)
CO2: 20 mmol/L — AB (ref 22–32)
CO2: 20 mmol/L — AB (ref 22–32)
CO2: 20 mmol/L — ABNORMAL LOW (ref 22–32)
Calcium: 8.8 mg/dL — ABNORMAL LOW (ref 8.9–10.3)
Chloride: 122 mmol/L — ABNORMAL HIGH (ref 101–111)
Chloride: 126 mmol/L — ABNORMAL HIGH (ref 101–111)
Chloride: 126 mmol/L — ABNORMAL HIGH (ref 101–111)
Creatinine, Ser: 0.96 mg/dL (ref 0.44–1.00)
Creatinine, Ser: 0.89 mg/dL (ref 0.44–1.00)
Creatinine, Ser: 0.91 mg/dL (ref 0.44–1.00)
Creatinine, Ser: 0.92 mg/dL (ref 0.44–1.00)
GFR calc Af Amer: >60 mL/min (ref >60)
GFR calc Af Amer: >60 mL/min (ref >60)
GFR calc Af Amer: >60 mL/min (ref >60)
GFR calc Af Amer: >60 mL/min (ref >60)
GFR calc non Af Amer: >60 mL/min (ref >60)
GFR calc non Af Amer: >60 mL/min (ref >60)
GFR calc non Af Amer: >60 mL/min (ref >60)
GLUCOSE: 142 mg/dL — AB (ref 65–99)
GLUCOSE: 147 mg/dL — AB (ref 65–99)
GLUCOSE: 140 mg/dL — AB (ref 65–99)
GLUCOSE: 141 mg/dL — AB (ref 65–99)
POTASSIUM: 3.4 mmol/L — AB (ref 3.5–5.1)
Potassium: 3.8 mmol/L (ref 3.5–5.1)
Potassium: 3.6 mmol/L (ref 3.5–5.1)
Potassium: 3.4 mmol/L — ABNORMAL LOW (ref 3.5–5.1)
Sodium: 149 mmol/L — ABNORMAL HIGH (ref 135–145)
Sodium: 153 mmol/L — ABNORMAL HIGH (ref 135–145)
Sodium: 153 mmol/L — ABNORMAL HIGH (ref 135–145)
Sodium: 156 mmol/L — ABNORMAL HIGH (ref 135–145)

## 2017-05-16 LAB — TRIGLYCERIDES: Triglycerides: 96 mg/dL (ref <150)

## 2017-05-16 LAB — SODIUM
Sodium: 152 mmol/L — ABNORMAL HIGH (ref 135–145)
Sodium: 153 mmol/L — ABNORMAL HIGH (ref 135–145)
Sodium: 156 mmol/L — ABNORMAL HIGH (ref 135–145)

## 2017-05-16 LAB — HIV ANTIBODY (ROUTINE TESTING W REFLEX): HIV SCREEN 4TH GENERATION: NONREACTIVE

## 2017-05-16 MED ORDER — SODIUM CHLORIDE 0.9 % IV SOLN: INTRAVENOUS | Status: DC | PRN

## 2017-05-16 MED ORDER — MIDAZOLAM HCL 2 MG/2ML IJ SOLN
2.0000 mg | Freq: Once | INTRAMUSCULAR | Status: AC
  Administered 2017-05-16: via INTRAVENOUS

## 2017-05-16 MED ORDER — LABETALOL HCL 5 MG/ML IV SOLN
20.0000 mg | Freq: Once | INTRAVENOUS | Status: AC
  Administered 2017-05-16: 20 mg via INTRAVENOUS
  Filled 2017-05-16: qty 4

## 2017-05-16 MED ORDER — MIDAZOLAM HCL 2 MG/2ML IJ SOLN
2.0000 mg | INTRAMUSCULAR | Status: DC | PRN
  Administered 2017-05-16 – 2017-05-19 (×3): 2 mg via INTRAVENOUS
  Filled 2017-05-16 (×2): qty 2

## 2017-05-16 MED ORDER — CHLORHEXIDINE GLUCONATE 0.12% ORAL RINSE (MEDLINE KIT)
15.0000 mL | Freq: Two times a day (BID) | OROMUCOSAL | Status: DC
  Administered 2017-05-16 – 2017-05-22 (×13): 15 mL via OROMUCOSAL

## 2017-05-16 MED ORDER — PROPOFOL 1000 MG/100ML IV EMUL
5.0000 ug/kg/min | INTRAVENOUS | Status: DC
  Administered 2017-05-16: 50 ug/kg/min via INTRAVENOUS

## 2017-05-16 MED ORDER — FENTANYL CITRATE (PF) 100 MCG/2ML IJ SOLN
50.0000 ug | Freq: Once | INTRAMUSCULAR | Status: AC
  Administered 2017-05-16: 50 ug via INTRAVENOUS

## 2017-05-16 MED ORDER — ORAL CARE MOUTH RINSE
15.0000 mL | OROMUCOSAL | Status: DC
  Administered 2017-05-16 – 2017-05-22 (×62): 15 mL via OROMUCOSAL

## 2017-05-16 MED ORDER — ROCURONIUM BROMIDE 50 MG/5ML IV SOLN
20.0000 mg | Freq: Once | INTRAVENOUS | Status: AC
  Administered 2017-05-16: 20 mg via INTRAVENOUS

## 2017-05-16 MED ORDER — SODIUM CHLORIDE 3 % IV SOLN
INTRAVENOUS | Status: AC
  Administered 2017-05-16 – 2017-05-17 (×2): 75 mL/h via INTRAVENOUS
  Filled 2017-05-16 (×5): qty 500

## 2017-05-16 MED ORDER — FENTANYL CITRATE (PF) 100 MCG/2ML IJ SOLN
25.0000 ug | INTRAMUSCULAR | Status: DC | PRN
  Administered 2017-05-16 – 2017-05-22 (×10): 25 ug via INTRAVENOUS
  Filled 2017-05-16 (×10): qty 2

## 2017-05-16 MED ORDER — PROPOFOL 1000 MG/100ML IV EMUL
0.0000 ug/kg/min | INTRAVENOUS | Status: DC
  Administered 2017-05-16 (×2): 40 ug/kg/min via INTRAVENOUS
  Administered 2017-05-16: 30 ug/kg/min via INTRAVENOUS
  Administered 2017-05-16: 40 ug/kg/min via INTRAVENOUS
  Administered 2017-05-16: 30 ug/kg/min via INTRAVENOUS
  Administered 2017-05-17: 40 ug/kg/min via INTRAVENOUS
  Administered 2017-05-17: 20 ug/kg/min via INTRAVENOUS
  Administered 2017-05-17 – 2017-05-18 (×3): 40 ug/kg/min via INTRAVENOUS
  Administered 2017-05-18: 50 ug/kg/min via INTRAVENOUS
  Administered 2017-05-19: 40 ug/kg/min via INTRAVENOUS
  Filled 2017-05-16 (×12): qty 100

## 2017-05-16 MED ORDER — ETOMIDATE 2 MG/ML IV SOLN
20.0000 mg | Freq: Once | INTRAVENOUS | Status: DC
  Administered 2017-05-16: 20 mg via INTRAVENOUS

## 2017-05-16 MED ORDER — MIDAZOLAM HCL 2 MG/2ML IJ SOLN
2.0000 mg | INTRAMUSCULAR | Status: DC | PRN
  Administered 2017-05-18: 2 mg via INTRAVENOUS
  Filled 2017-05-16 (×2): qty 2

## 2017-05-16 MED ORDER — LISINOPRIL 20 MG PO TABS
20.0000 mg | ORAL_TABLET | Freq: Two times a day (BID) | ORAL | Status: DC
Start: 2017-05-16 — End: 2017-05-22
  Administered 2017-05-16 – 2017-05-22 (×13): 20 mg via ORAL
  Filled 2017-05-16 (×13): qty 1

## 2017-05-16 MED ORDER — LABETALOL HCL 100 MG PO TABS
100.0000 mg | ORAL_TABLET | Freq: Three times a day (TID) | ORAL | Status: DC
  Administered 2017-05-16 – 2017-05-17 (×6): 100 mg via ORAL
  Filled 2017-05-16 (×6): qty 1

## 2017-05-16 MED ORDER — DOCUSATE SODIUM 50 MG/5ML PO LIQD: 100.0000 mg | Freq: Two times a day (BID) | ORAL | Status: DC | PRN

## 2017-05-16 MED ORDER — FENTANYL CITRATE (PF) 100 MCG/2ML IJ SOLN
INTRAMUSCULAR | Status: AC
  Filled 2017-05-16: qty 2

## 2017-05-16 MED ORDER — MIDAZOLAM HCL 2 MG/2ML IJ SOLN
INTRAMUSCULAR | Status: AC
  Filled 2017-05-16: qty 2

## 2017-05-16 NOTE — Progress Notes (Signed)
Patient transported to CT without complications. Vitals stable throughout. Patient tolerated well. RT will continue to monitor. 

## 2017-05-16 NOTE — Procedures (Signed)
Arterial Catheter Insertion Procedure Note Kendra Barry [id number] 04/25/1965  Procedure: Insertion of Arterial Catheter  Indications: Blood pressure monitoring and Frequent blood sampling  Procedure Details Consent: Unable to obtain consent because of altered level of consciousness. Time Out: Verified patient identification, verified procedure, site/side was marked, verified correct patient position, special equipment/implants available, medications/allergies/relevent history reviewed, required imaging and test results available.  Performed  Maximum sterile technique was used including antiseptics, cap, gloves, gown, hand hygiene, mask and sheet. Skin prep: Chlorhexidine; local anesthetic administered 20 gauge catheter was inserted into left radial artery using the Seldinger technique. ULTRASOUND GUIDANCE USED: NO Evaluation Blood flow good; BP tracing good. Complications: No apparent complications.   Inez PilgrimCOCKMAN, Itzy Adler 05/16/2017

## 2017-05-16 NOTE — Procedures (Signed)
PREOP DX: Hydrocephalus  POSTOP DX: Same  PROCEDURE: Right frontal ventriculostomy   SURGEON: Dr. Lisbeth RenshawNeelesh Almira Phetteplace, MD  ASSIST: Cindra PresumeVincent Costella, PA-C  ANESTHESIA: IV Sedation (versed and fentanyl) with Local  EBL: Minimal  SPECIMENS: None  COMPLICATIONS: None  CONDITION: Hemodynamically stable  INDICATIONS: Mrs. Kendra Barry is a 52 y.o. female presenting with right thalamic hypertensive hemorrhage with IVH. She declined neurologically with CT demonstrating progressive ventriculomegaly. EVD was therefore indicated. Risks and benefits were reviewed with the patient's family. After questions were answered, consent was obtained.  PROCEDURE IN DETAIL: After consent was obtained from the patient's family, skin of the right frontal scalp was clipped, prepped and draped in the usual sterile fashion.  Scalp was then infiltrated with local anesthetic with epinephrine.  Skin incision was made sharply, and twist drill burr hole was made.  The dura was then incised, and attempt was made to pass the ventricular catheter unsuccessfully. We therefore created a new more medial incision and another bur hole. Dura was incised and the ventricular catheter was passed in one attempt into the right lateral ventricle.  Good CSF flow was obtained.  The catheter was then tunneled subcutaneously and connected to a drainage system and the skin incision closed.  The drain was then secured in place.  FINDINGS: 1. Opening pressure ~5cmH20 2. bloody CSF

## 2017-05-16 NOTE — Consult Note (Signed)
.. ..  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 Pt was LKN at 8 am that morning. En route to the hospital EMS noted seizure like activity. Her UDS was positive for cocaine. Admitted by Neurology for Malignant HTN with Acute intracranial hemorrhage. Called to bedside by Neurology this evening after recent [facility] and reports that pt had neurological decline. We evaluated the patient.  Lethargic sonorous not following commands with rightward downward gaze, not desaturating but copious secretions - concern for airway protection. Decision made to intubate and PCCM asked to assist in mgmt.    PAST MEDICAL HISTORY :   has no past medical history on file.  has no past surgical history on file. Prior to Admission medications   Not on File   Not on File  FAMILY HISTORY:  family history is not on file. SOCIAL HISTORY:    REVIEW OF SYSTEMS:  ( unable to obtain due to acute encephalopathy secondary to intracranial hemorrhage) Constitutional: Negative for fever, chills, weight loss, malaise/fatigue and diaphoresis.  HENT: Negative for hearing loss, ear pain, nosebleeds, congestion, sore throat, neck pain, tinnitus and ear discharge.   Eyes: Negative for blurred vision, double vision, photophobia, pain, discharge and redness.  Respiratory:  Negative for cough, hemoptysis, sputum production, shortness of breath, wheezing and stridor.   Cardiovascular: Negative for chest pain, palpitations, orthopnea, claudication, leg swelling and PND.  Gastrointestinal: Negative for heartburn, nausea, vomiting, abdominal pain, diarrhea, constipation, blood in stool and melena.  Genitourinary: Negative for dysuria, urgency, frequency, hematuria and flank pain.  Musculoskeletal: Negative for myalgias, back pain, joint pain and falls.  Skin: Negative for itching and rash.  Neurological: Negative for dizziness, tingling, tremors, sensory change, speech change, focal weakness, seizures, loss of consciousness, weakness and headaches.  Endo/Heme/Allergies: Negative for environmental allergies and polydipsia. Does not bruise/bleed easily.  SUBJECTIVE:   VITAL SIGNS: Temp:  [95.4 F (35.2 C)-99.1 F (37.3 C)] 99.1 F (37.3 C) (04/14 0013) Pulse Rate:  [77-111] 95 (04/14 0045) Resp:  [13-40] 24 (04/14 0045) BP: (98-212)/(64-137) 112/67 (04/14 0045) SpO2:  [89 %-100 %] 97 % (04/14 0045) Arterial Line BP: (91)/(59) 91/59 (04/14 0045) FiO2 (%):  [100 %] 100 % (04/14 0010) Weight:  [65.2 kg (143 lb 11.8 oz)] 65.2 kg (143 lb 11.8 oz) (04/13 1219)  PHYSICAL EXAMINATION: General:  lethargic Neuro:  GCS 8 HEENT:  NCAT, lots of secretions Cardiovascular:  S1 and S2 appreciated Lungs:   Coarse b/l breath sounds Abdomen:  Soft not distended + BS Musculoskeletal:  No gross deformites Skin:  Grossly intact  Recent Labs  Lab May 22, 2017 1120 05/17/2017 1129 05/21/2017 1402 05/21/2017 1952  NA 138 141 138 145  K 2.8* 2.8*  --   --   CL 103 102  --   --   CO2 26  --   --   --  BUN 10 12  --   --   CREATININE 1.08* 0.90  --   --   GLUCOSE 180* 172*  --   --    Recent Labs  Lab 2017/09/30 1120 2017/09/30 1129  HGB 14.2 15.0  HCT 42.6 44.0  WBC 6.4  --   PLT 194  --    Ct Head Wo Contrast  Result Date: 11/07/17 CLINICAL DATA:  Follow-up stroke.  EXAM: CT HEAD WITHOUT CONTRAST TECHNIQUE: Contiguous axial images were obtained from the base of the skull through the vertex without intravenous contrast. COMPARISON:  CT HEAD May 15, 2017 at 1350 hours FINDINGS: BRAIN: 3.1 x 3.3 x 4 cm similar RIGHT thalamus intraparenchymal hemorrhage with surrounding low-density vasogenic edema. Intraventricular extension again noted with 7 mm stable RIGHT to LEFT subfalcine herniation. Slightly increasing hydrocephalus. Confluent supratentorial white matter hypodensities exclusive a aforementioned abnormality. Old small LEFT cerebellar infarct. Old suspected RIGHT basal ganglia infarct. No abnormal extra-axial fluid collections. Basal cisterns are patent. VASCULAR: Unremarkable. SKULL/SOFT TISSUES: No skull fracture. No significant soft tissue swelling. ORBITS/SINUSES: The included ocular globes and orbital contents are normal.Trace paranasal sinus mucosal thickening. Mastoid air cells are well aerated. OTHER: None. IMPRESSION: 1. Evolving acute RIGHT thalamus intraparenchymal hematoma with similar intraventricular extension. Similar 7 mm subfalcine herniation. 2. Slightly worsening mild hydrocephalus. Electronically Signed   By: Awilda Metroourtnay  Bloomer M.D.   On: 010/06/19 22:30   Ct Head Wo Contrast  Result Date: 11/07/17 CLINICAL DATA:  Follow up cerebral hemorrhage. EXAM: CT HEAD WITHOUT CONTRAST TECHNIQUE: Contiguous axial images were obtained from the base of the skull through the vertex without intravenous contrast. COMPARISON:  Head CT from earlier same day. FINDINGS: Brain: The acute parenchymal hemorrhage centered in the RIGHT basal ganglia region is not significantly changed compared to the study performed earlier today, measuring 4 cm transverse dimension by 3.7 cm craniocaudal dimension (coronal series 5, image 41). The surrounding edema is also not significantly changed with associated mass effect and midline shift measuring 7 mm. Again noted is moderate to  large amount of acute intraventricular hemorrhage, again most prominently seen within the RIGHT lateral ventricle, also seen within the third ventricle and fourth ventricle, all of which appears stable. Trace subarachnoid hemorrhage noted within the LEFT sylvian fissure, stable although better seen on the present exam. Mild ventricular prominence is stable. No evidence of transtentorial or tonsillar herniation. Confluent areas of low density are again seen throughout the bilateral periventricular and subcortical white matter regions consistent with chronic small vessel ischemic changes. Small old infarct again noted within the LEFT cerebellum with associated encephalomalacia. Vascular: No hyperdense vessel or unexpected calcification. Skull: Normal. Negative for fracture or focal lesion. Sinuses/Orbits: No acute finding. Other: None. IMPRESSION: 1. Acute intracranial hemorrhage, as detailed above, including the dominant parenchymal hemorrhage centered in the RIGHT basal ganglia which is stable in size and extent compared to the CT performed earlier today. Associated mass effect appears stable with 7 mm leftward midline shift. 2. Moderate amount of intraventricular hemorrhage appears stable. Associated mild ventricular dilatation. 3. Chronic small vessel ischemic changes within the white matter. Electronically Signed   By: Bary RichardStan  Maynard M.D.   On: 010/06/19 14:24   Chest Port 1 View  Result Date: 11/07/17 CLINICAL DATA:  Intracerebral hemorrhage, stroke-like symptoms. Incontinent of urine, vomiting. Seizure like activity. EXAM: PORTABLE CHEST 1 VIEW COMPARISON:  None. FINDINGS: Borderline cardiomegaly. Lungs are clear. No pleural effusion or pneumothorax seen. No acute or suspicious osseous finding. IMPRESSION: No active  disease. No evidence of pneumonia, pleural effusion or pulmonary edema. Electronically Signed   By: Bary Richard M.D.   On: 05/11/2017 14:14   Ct Head Code Stroke Wo Contrast  Result  Date: 05/18/2017 CLINICAL DATA:  Code stroke. 52 year old female with altered mental status and incontinence. Last seen normal 0800 hours. EXAM: CT HEAD WITHOUT CONTRAST TECHNIQUE: Contiguous axial images were obtained from the base of the skull through the vertex without intravenous contrast. COMPARISON:  None. FINDINGS: Brain: Acute intracranial hemorrhage. There is intra-axial hyperdense hemorrhage centered at the right deep gray matter nuclei, perhaps originating at the right thalamus with extension into the ventricular system. The intra-axial component of blood encompasses 35 x 40 x 37 millimeters for an estimated blood volume of 26 milliliters. There is surrounding edema with regional mass effect, including midline shift of 8 millimeters near the site of the hemorrhage. There is a moderate volume of intraventricular extension of blood, present in the right lateral ventricle, the 3rd and 4th ventricles. Questionable mild lateral ventriculomegaly at this time. The temporal horns remain diminutive though. Confluent bilateral cerebral white matter hypodensity including involvement of the deep white matter capsules. There is a small chronic appearing infarct in the left cerebellar hemisphere. No other cortical encephalomalacia identified. Basilar cisterns remain patent. No superimposed acute cortically based infarct. Vascular: No suspicious intracranial vascular hyperdensity. Skull: No acute osseous abnormality identified. Hyperostosis, probably a normal variant. Sinuses/Orbits: Clear. Other: Disconjugate gaze. Otherwise negative orbit and scalp soft tissues. ASPECTS [facility] Stroke Program Early CT Score) Total score (0-10 with 10 being normal): Not applicable, acute hemorrhage. IMPRESSION: 1. Acute intracranial hemorrhage. Right deep gray matter/thalamic intra-axial hematoma estimated at 26 mL. Surrounding edema and regional mass effect including 8 millimeters of leftward midline shift. 2. Moderate volume of  intraventricular extension of hemorrhage. Questionable mild subsequent ventriculomegaly. 3. 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 Small chronic left cerebellar infarct. Preliminary results were communicated to Dr. Otelia Limes at 11:47 amon 04/06/2019by text page via the [facility] messaging system. Electronically Signed   By: Odessa Fleming M.D.   On: 05/18/2017 11:48   [place] Ekg Site Rite  Result Date: 05/15/2017 If Site Rite image not attached, placement could not be confirmed due to current cardiac rhythm.   ASSESSMENT / PLAN: NEURO: Acute Encephalopathy secondary to worsening ICH GCS 8 prior to intubation Sedation with Propofol RASS goal 0 to -1 No seizures noted during my evaluation Last CTH  Right deep gray matter/thalamic intra-axial hematoma estimated at 26 mL. Surrounding edema and regional mass effect including 8 millimeters of leftward midline shift. There is moderate volume of intraventricular extension of hemorrhage. Questionable mild subsequent ventriculomegaly. Also seen is evidence of advanced underlying chronic small vessel disease and a small chronic left cerebellar infarct. Now on most recent scan: Evolving acute RIGHT thalamus intraparenchymal hematoma with similar intraventricular extension. Similar 7 mm subfalcine herniation. Slightly worsening mild hydrocephalus--> NSG at bedside for EVD placement.  Hyperosmotic Therapy Previously on Hypertonic saline 3% at 75cc/hr Now switched to 23% Goal Sodium 150-160 per Neuro.  CARDIAC: Malignant HTN H/o cocaine use: + cocaine on admission Avoid beta blockade Continue Cleviprex ggt A- line is placed Left radial  BP goal sys 120-140  PULMONARY: Acute Respiratory Failure with Hypoxia Secondary to altered mental status from ICH Intubated on PRVC  TV 8cc/kg  ID: No concern for any infective process at this juncture  No reports of aspiration per RN No antibiotics and no isolation Trend WBC and fever curve  Endocrine: Hgb A1c 5.4% no h/o DM BG 172 on last check Continue BG monitoring  If BG >180 start SSI  GI: NPO OGT placed post intubation Hold TF  PPI on board for prophylaxis   Heme: Intracranial Hemorrhage Hgb 14 Plts 194 If Hgb<7 transfuse PRBCs Hold all anticoagulation   RENAL Baseline Cr Acute Kidney Injury Lab Results  Component Value Date   CREATININE 0.90 05/17/2017   CREATININE 1.08 (H) 05/24/2017  Hypokalemia noted on prev BMET Pt received replacement Repeat BMET now    I, Dr Newell Coral have personally reviewed patient's available data, including medical history, events of note, physical examination and test results as part of my evaluation. I have discussed withother care providers such as RN, Neurology and Neurosurgery The patient is critically ill with multiple organ systems failure and requires high complexity decision making for assessment and support, frequent evaluation and titration of therapies, application of advanced monitoring technologies and extensive interpretation of multiple databases. Critical Care Time devoted to patient care services described in this note is 65 Minutes. This time reflects time of care of this signee Dr Newell Coral. This critical care time does not reflect procedure time, or teaching time or supervisory time but could involve care discussion time    DISPOSITION: ICU 4 N CC TIME: 65 mins PROGNOSIS: Guarded FAMILY: called and spoken to over phone by Neurology, updated on current events CODE STATUS: Full   Signed Dr Newell Coral Pulmonary Critical Care Locums   05/16/2017, 12:56 AM

## 2017-05-16 NOTE — Progress Notes (Signed)
Arterial Line has a wipe, RN has try repositioning and zeroing line. BP goals base on cuff BP.  Will continue to monitor.

## 2017-05-16 NOTE — Progress Notes (Signed)
No issues overnight after EVD placed.  EXAM:  BP 119/64   Pulse 92   Temp 99 F (37.2 C) (Axillary)   Resp (!) 24   Ht 5\' 3"  (1.6 m)   Wt 65.2 kg (143 lb 11.8 oz)   LMP  (LMP Unknown)   SpO2 100%   BMI 25.46 kg/m   No eye opening to pain Pupils pinpoint W/D to pain RUE, BLE Extensor posturing LUE to pain EVD in place, functional but no significant output  IMAGING: CT head reviewed, EVD in place, somewhat deep into the right frontobasal region. Unchanged hemorrhage, no HCP  IMPRESSION:  52 y.o. female s/p hypertensive thalamic/IVH. EVD functional but no real output suggesting no real HCP. While placement of EVD is somewhat deep, it remains functional so will leave in place for now.  PLAN: - Will cont EVD open at 10 for now - Mgmt per neurology.

## 2017-05-16 NOTE — Consult Note (Signed)
.. ..  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 Pt was LKN at 8 am that morning. En route to the hospital EMS noted seizure like activity. Her UDS was positive for cocaine. Admitted by Neurology for Malignant HTN with Acute intracranial hemorrhage. Called to bedside by Neurology this evening [facility] and reports that pt had neurological decline. We evaluated the patient.  Lethargic sonorous not following commands with rightward downward gaze, not desaturating but copious secretions - concern for airway protection. Decision made to intubate and PCCM asked to assist in mgmt.  05/16/17 The patient was intubated and is presently sedated on the ventitlator. She just went down for a new CT scan. Has been getting 3% NaCL at about 75 cc/hr. The ICP manometer is set at 1`4 cm h20 and not draining presently    PAST MEDICAL HISTORY :   has no past medical history on file.  has no past surgical history on file. Prior to Admission medications   Not on File   Not on File    VITAL SIGNS: Temp:  [95.4 F (35.2 C)-99.1 F (37.3 C)] 99 F (37.2 C) (04/14 0400) Pulse Rate:  [77-111] 96 (04/14 0852) Resp:  [13-40] 24 (04/14 0715) BP: (98-212)/(59-139) 115/68 (04/14 0852) SpO2:  [89 %-100 %] 100 % (04/14 0715) Arterial Line BP:  (91-181)/(51-84) 129/52 (04/14 0715) FiO2 (%):  [40 %-100 %] 40 % (04/14 0853) Weight:  [143 lb 11.8 oz (65.2 kg)] 143 lb 11.8 oz (65.2 kg) (04/13 1219)  PHYSICAL EXAMINATION: General:  Patient is sedated on the ventilator , not moving . Pupils about 2mm and weakly reactive and midlineNeuro:  GCS 8 HEENT:  NCAT, lots of secretions Cardiovascular:  S1 and S2 appreciated Lungs:   Coarse b/l breath sounds Abdomen:  Soft not distended + BS Musculoskeletal:  No gross deformites Skin:  Grossly intact  Recent Labs  Lab 0September 15, 2019 1120 0September 15, 2019 1129 0September 15, 2019 1402 0September 15, 2019 1952 05/16/17 0230  NA 138 141 138 145 149*  K 2.8* 2.8*  --   --  3.8  CL 103 102  --   --  122*  CO2 26  --   --   --  19*  BUN 10 12  --   --  8  CREATININE 1.08* 0.90  --   --  0.96  GLUCOSE 180* 172*  --   --  142*   Recent Labs  Lab 0September 15, 2019 1120 0September 15, 2019 1129  HGB 14.2 15.0  HCT 42.6 44.0  WBC 6.4  --   PLT 194  --    Ct Head Wo Contrast  Result Date: 03-14-2017 CLINICAL DATA:  Follow-up stroke. EXAM: CT HEAD WITHOUT CONTRAST TECHNIQUE: Contiguous axial images were obtained from the base of the skull through the vertex without intravenous contrast. COMPARISON:  CT HEAD May 15, 2017 at 1350 hours FINDINGS: BRAIN:  3.1 x 3.3 x 4 cm similar RIGHT thalamus intraparenchymal hemorrhage with surrounding low-density vasogenic edema. Intraventricular extension again noted with 7 mm stable RIGHT to LEFT subfalcine herniation. Slightly increasing hydrocephalus. Confluent supratentorial white matter hypodensities exclusive a aforementioned abnormality. Old small LEFT cerebellar infarct. Old suspected RIGHT basal ganglia infarct. No abnormal extra-axial fluid collections. Basal cisterns are patent. VASCULAR: Unremarkable. SKULL/SOFT TISSUES: No skull fracture. No significant soft tissue swelling. ORBITS/SINUSES: The included ocular globes and orbital contents are normal.Trace paranasal sinus mucosal thickening. Mastoid air  cells are well aerated. OTHER: None. IMPRESSION: 1. Evolving acute RIGHT thalamus intraparenchymal hematoma with similar intraventricular extension. Similar 7 mm subfalcine herniation. 2. Slightly worsening mild hydrocephalus. Electronically Signed   By: Awilda Metro M.D.   On: 05/13/2017 22:30   Ct Head Wo Contrast  Result Date: 05/19/2017 CLINICAL DATA:  Follow up cerebral hemorrhage. EXAM: CT HEAD WITHOUT CONTRAST TECHNIQUE: Contiguous axial images were obtained from the base of the skull through the vertex without intravenous contrast. COMPARISON:  Head CT from earlier same day. FINDINGS: Brain: The acute parenchymal hemorrhage centered in the RIGHT basal ganglia region is not significantly changed compared to the study performed earlier today, measuring 4 cm transverse dimension by 3.7 cm craniocaudal dimension (coronal series 5, image 41). The surrounding edema is also not significantly changed with associated mass effect and midline shift measuring 7 mm. Again noted is moderate to large amount of acute intraventricular hemorrhage, again most prominently seen within the RIGHT lateral ventricle, also seen within the third ventricle and fourth ventricle, all of which appears stable. Trace subarachnoid hemorrhage noted within the LEFT sylvian fissure, stable although better seen on the present exam. Mild ventricular prominence is stable. No evidence of transtentorial or tonsillar herniation. Confluent areas of low density are again seen throughout the bilateral periventricular and subcortical white matter regions consistent with chronic small vessel ischemic changes. Small old infarct again noted within the LEFT cerebellum with associated encephalomalacia. Vascular: No hyperdense vessel or unexpected calcification. Skull: Normal. Negative for fracture or focal lesion. Sinuses/Orbits: No acute finding. Other: None. IMPRESSION: 1. Acute intracranial hemorrhage, as detailed above, including the dominant  parenchymal hemorrhage centered in the RIGHT basal ganglia which is stable in size and extent compared to the CT performed earlier today. Associated mass effect appears stable with 7 mm leftward midline shift. 2. Moderate amount of intraventricular hemorrhage appears stable. Associated mild ventricular dilatation. 3. Chronic small vessel ischemic changes within the white matter. Electronically Signed   By: Bary Richard M.D.   On: 05/21/2017 14:24   Dg Chest Port 1 View  Result Date: 05/16/2017 CLINICAL DATA:  Endotracheal and OG tube placement. EXAM: PORTABLE CHEST 1 VIEW COMPARISON:  Radiograph earlier this day. FINDINGS: Endotracheal tube 4.1 cm from the carina. Enteric tube in place with tip and side-port below the diaphragm. Right upper extremity central line with tip in the distal SVC. No pneumothorax. Heart is normal in size. No pulmonary edema, pleural effusion or pneumothorax. IMPRESSION: 1. Endotracheal tube 4.1 cm from the carina. Tip and side port of the enteric tube below the diaphragm. Right upper extremity PICC tip in the distal SVC. 2.  No acute pulmonary process. Electronically Signed   By: Rubye Oaks M.D.   On: 05/16/2017 00:56   Chest Port 1 View  Result Date: 05/28/2017 CLINICAL DATA:  Intracerebral hemorrhage, stroke-like symptoms. Incontinent of urine, vomiting. Seizure like activity. EXAM: PORTABLE CHEST 1 VIEW COMPARISON:  None. FINDINGS: Borderline cardiomegaly. Lungs are clear. No  pleural effusion or pneumothorax seen. No acute or suspicious osseous finding. IMPRESSION: No active disease. No evidence of pneumonia, pleural effusion or pulmonary edema. Electronically Signed   By: Bary Richard M.D.   On: 05-29-17 14:14   Ct Head Code Stroke Wo Contrast  Result Date: 2017-05-29 CLINICAL DATA:  Code stroke. 52 year old female with altered mental status and incontinence. Last seen normal 0800 hours. EXAM: CT HEAD WITHOUT CONTRAST TECHNIQUE: Contiguous axial images were  obtained from the base of the skull through the vertex without intravenous contrast. COMPARISON:  None. FINDINGS: Brain: Acute intracranial hemorrhage. There is intra-axial hyperdense hemorrhage centered at the right deep gray matter nuclei, perhaps originating at the right thalamus with extension into the ventricular system. The intra-axial component of blood encompasses 35 x 40 x 37 millimeters for an estimated blood volume of 26 milliliters. There is surrounding edema with regional mass effect, including midline shift of 8 millimeters near the site of the hemorrhage. There is a moderate volume of intraventricular extension of blood, present in the right lateral ventricle, the 3rd and 4th ventricles. Questionable mild lateral ventriculomegaly at this time. The temporal horns remain diminutive though. Confluent bilateral cerebral white matter hypodensity including involvement of the deep white matter capsules. There is a small chronic appearing infarct in the left cerebellar hemisphere. No other cortical encephalomalacia identified. Basilar cisterns remain patent. No superimposed acute cortically based infarct. Vascular: No suspicious intracranial vascular hyperdensity. Skull: No acute osseous abnormality identified. Hyperostosis, probably a normal variant. Sinuses/Orbits: Clear. Other: Disconjugate gaze. Otherwise negative orbit and scalp soft tissues. ASPECTS [facility] Stroke Program Early CT Score) Total score (0-10 with 10 being normal): Not applicable, acute hemorrhage. IMPRESSION: 1. Acute intracranial hemorrhage. Right deep gray matter/thalamic intra-axial hematoma estimated at 26 mL. Surrounding edema and regional mass effect including 8 millimeters of leftward midline shift. 2. Moderate volume of intraventricular extension of hemorrhage. Questionable mild subsequent ventriculomegaly. 3. 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 Small chronic left cerebellar infarct. Preliminary results  were communicated to Dr. Otelia Limes at 11:47 amon Apr 27, 2019by text page via the [facility] messaging system. Electronically Signed   By: Odessa Fleming M.D.   On: 05-29-17 11:48   [place] Ekg Site Rite  Result Date: 05-29-2017 If Site Rite image not attached, placement could not be confirmed due to current cardiac rhythm.   ASSESSMENT / PLAN: NEURO: Acute Encephalopathy secondary to worsening ICH GCS 8 prior to intubation Sedation with Propofol RASS goal 0 to -1 No seizures noted during my evaluation Last CTH  Right deep gray matter/thalamic intra-axial hematoma estimated at 26 mL. Surrounding edema and regional mass effect including 8 millimeters of leftward midline shift. There is moderate volume of intraventricular extension of hemorrhage. Questionable mild subsequent ventriculomegaly. Also seen is evidence of advanced underlying chronic small vessel disease and a small chronic left cerebellar infarct. Now on most recent scan: Evolving acute RIGHT thalamus intraparenchymal hematoma with similar intraventricular extension. Similar 7 mm subfalcine herniation. Slightly worsening mild hydrocephalus--> NSG at bedside for EVD placement. Repeat CT scan pending The patient is on 3% Nacl at 75 cc/hr Her Na is 149. Serum osmolariy is about 300 mEq/l Goal Sodium 150-160 per Neuro.  CARDIAC: Malignant HTN H/o cocaine use: + cocaine on admission Avoid beta blockade Continue Cleviprex ggt A- line is placed Left radial  BP goal sys 120-140  PULMONARY: Acute Respiratory Failure with Hypoxia Secondary to altered mental status from ICH Intubated on PRVC  TV 8cc/kg.Last ABG was 7.38?paCO2 of 35 and  pa02 414.  ID: No concern for any infective process at this juncture  No reports of aspiration per RN No antibiotics and no isolation Trend WBC and fever curve   Endocrine: Hgb A1c 5.4% no h/o DM BG 172 on last check Continue BG monitoring  If BG >180 start SSI    Lab Results  Component Value Date    CREATININE 0.96 05/16/2017   CREATININE 0.90 05/21/2017   CREATININE 1.08 (H) 05/14/2017  Hypokalemia noted on prev BMET Pt received replacement Repeat BMET now   DISPOSITION: ICU 4 N CC TIME: 40 minutes PROGNOSIS: Guarded   CODE STATUS: Full   Jamesetta So MD [facility] 05/16/2017, 9:25 AM

## 2017-05-16 NOTE — Progress Notes (Addendum)
On return from CT Neuro MD to bedside. Neuro MD to get PCCM consult for airway.

## 2017-05-16 NOTE — Progress Notes (Signed)
Neuro MD paged with chloride of >130. No new orders.

## 2017-05-16 NOTE — Progress Notes (Signed)
Unable to get SYS BP <140. MD paged. Order for 20 of Labetalol once and PRN fentanyl .

## 2017-05-16 NOTE — Progress Notes (Signed)
STROKE TEAM PROGRESS NOTE   SUBJECTIVE (INTERVAL HISTORY) Her RN is at the bedside. Pt remains to be intubated, on vent.  Head CT repeated this morning showed midline shift 8 mm slightly worsening than last night.  Mild hydrocephalus.  Had EVD placed, EVD patent but no CSF drainage yet.  Patient on 3% saline with 23.4% intermittently, sodium 152 this morning.  UDS positive for cocaine.  BP still high on Cleviprex and propofol and will give labetalol as needed every hour, will put on p.o. Medications too, goal less than 140.   OBJECTIVE Temp:  [95.4 F (35.2 C)-99.1 F (37.3 C)] 99 F (37.2 C) (04/14 0400) Pulse Rate:  [77-111] 83 (04/14 0715) Resp:  [13-40] 24 (04/14 0715) BP: (98-212)/(59-139) 113/61 (04/14 0715) SpO2:  [89 %-100 %] 100 % (04/14 0715) Arterial Line BP: (91-181)/(51-84) 129/52 (04/14 0715) FiO2 (%):  [40 %-100 %] 40 % (04/14 0408) Weight:  [143 lb 11.8 oz (65.2 kg)] 143 lb 11.8 oz (65.2 kg) (04/13 1219)  CBC:  Recent Labs  Lab 21-May-2017 1120 May 21, 2017 1129  WBC 6.4  --   NEUTROABS 2.9  --   HGB 14.2 15.0  HCT 42.6 44.0  MCV 88.0  --   PLT 194  --     Basic Metabolic Panel:  Recent Labs  Lab 2017/05/21 1120 05-21-2017 1129  05/21/2017 1952 05/16/17 0230  NA 138 141   < > 145 149*  K 2.8* 2.8*  --   --  3.8  CL 103 102  --   --  122*  CO2 26  --   --   --  19*  GLUCOSE 180* 172*  --   --  142*  BUN 10 12  --   --  8  CREATININE 1.08* 0.90  --   --  0.96  CALCIUM 9.4  --   --   --  8.7*   < > = values in this interval not displayed.    Lipid Panel:     Component Value Date/Time   TRIG 96 05/16/2017 0230   HgbA1c:  Lab Results  Component Value Date   HGBA1C 5.4 21-May-2017   Urine Drug Screen:     Component Value Date/Time   LABOPIA NONE DETECTED 05/21/2017 1302   COCAINSCRNUR POSITIVE (A) 05-21-17 1302   LABBENZ NONE DETECTED 2017/05/21 1302   AMPHETMU NONE DETECTED 21-May-2017 1302   THCU NONE DETECTED May 21, 2017 1302   LABBARB NONE DETECTED  2017/05/21 1302    Alcohol Level     Component Value Date/Time   ETH <10 2017-05-21 1235    IMAGING I have personally reviewed the radiological images below and agree with the radiology interpretations.  Ct Head Wo Contrast  Result Date: 21-May-2017 CLINICAL DATA:  Follow-up stroke. EXAM: CT HEAD WITHOUT CONTRAST TECHNIQUE: Contiguous axial images were obtained from the base of the skull through the vertex without intravenous contrast. COMPARISON:  CT HEAD 05/21/2017 at 1350 hours FINDINGS: BRAIN: 3.1 x 3.3 x 4 cm similar RIGHT thalamus intraparenchymal hemorrhage with surrounding low-density vasogenic edema. Intraventricular extension again noted with 7 mm stable RIGHT to LEFT subfalcine herniation. Slightly increasing hydrocephalus. Confluent supratentorial white matter hypodensities exclusive a aforementioned abnormality. Old small LEFT cerebellar infarct. Old suspected RIGHT basal ganglia infarct. No abnormal extra-axial fluid collections. Basal cisterns are patent. VASCULAR: Unremarkable. SKULL/SOFT TISSUES: No skull fracture. No significant soft tissue swelling. ORBITS/SINUSES: The included ocular globes and orbital contents are normal.Trace paranasal sinus mucosal thickening. Mastoid air  cells are well aerated. OTHER: None. IMPRESSION: 1. Evolving acute RIGHT thalamus intraparenchymal hematoma with similar intraventricular extension. Similar 7 mm subfalcine herniation. 2. Slightly worsening mild hydrocephalus. Electronically Signed   By: Awilda Metro M.D.   On: 06-13-17 22:30   Ct Head Wo Contrast  Result Date: 06/13/17 CLINICAL DATA:  Follow up cerebral hemorrhage. EXAM: CT HEAD WITHOUT CONTRAST TECHNIQUE: Contiguous axial images were obtained from the base of the skull through the vertex without intravenous contrast. COMPARISON:  Head CT from earlier same day. FINDINGS: Brain: The acute parenchymal hemorrhage centered in the RIGHT basal ganglia region is not significantly  changed compared to the study performed earlier today, measuring 4 cm transverse dimension by 3.7 cm craniocaudal dimension (coronal series 5, image 41). The surrounding edema is also not significantly changed with associated mass effect and midline shift measuring 7 mm. Again noted is moderate to large amount of acute intraventricular hemorrhage, again most prominently seen within the RIGHT lateral ventricle, also seen within the third ventricle and fourth ventricle, all of which appears stable. Trace subarachnoid hemorrhage noted within the LEFT sylvian fissure, stable although better seen on the present exam. Mild ventricular prominence is stable. No evidence of transtentorial or tonsillar herniation. Confluent areas of low density are again seen throughout the bilateral periventricular and subcortical white matter regions consistent with chronic small vessel ischemic changes. Small old infarct again noted within the LEFT cerebellum with associated encephalomalacia. Vascular: No hyperdense vessel or unexpected calcification. Skull: Normal. Negative for fracture or focal lesion. Sinuses/Orbits: No acute finding. Other: None. IMPRESSION: 1. Acute intracranial hemorrhage, as detailed above, including the dominant parenchymal hemorrhage centered in the RIGHT basal ganglia which is stable in size and extent compared to the CT performed earlier today. Associated mass effect appears stable with 7 mm leftward midline shift. 2. Moderate amount of intraventricular hemorrhage appears stable. Associated mild ventricular dilatation. 3. Chronic small vessel ischemic changes within the white matter. Electronically Signed   By: Bary Richard M.D.   On: 06-13-2017 14:24   Dg Chest Port 1 View  Result Date: 05/16/2017 CLINICAL DATA:  Endotracheal and OG tube placement. EXAM: PORTABLE CHEST 1 VIEW COMPARISON:  Radiograph earlier this day. FINDINGS: Endotracheal tube 4.1 cm from the carina. Enteric tube in place with tip and  side-port below the diaphragm. Right upper extremity central line with tip in the distal SVC. No pneumothorax. Heart is normal in size. No pulmonary edema, pleural effusion or pneumothorax. IMPRESSION: 1. Endotracheal tube 4.1 cm from the carina. Tip and side port of the enteric tube below the diaphragm. Right upper extremity PICC tip in the distal SVC. 2.  No acute pulmonary process. Electronically Signed   By: Rubye Oaks M.D.   On: 05/16/2017 00:56   Chest Port 1 View  Result Date: 06-13-2017 CLINICAL DATA:  Intracerebral hemorrhage, stroke-like symptoms. Incontinent of urine, vomiting. Seizure like activity. EXAM: PORTABLE CHEST 1 VIEW COMPARISON:  None. FINDINGS: Borderline cardiomegaly. Lungs are clear. No pleural effusion or pneumothorax seen. No acute or suspicious osseous finding. IMPRESSION: No active disease. No evidence of pneumonia, pleural effusion or pulmonary edema. Electronically Signed   By: Bary Richard M.D.   On: 06-13-17 14:14   Ct Head Code Stroke Wo Contrast  Result Date: 2017-06-13 CLINICAL DATA:  Code stroke. 52 year old female with altered mental status and incontinence. Last seen normal 0800 hours. EXAM: CT HEAD WITHOUT CONTRAST TECHNIQUE: Contiguous axial images were obtained from the base of the skull through the vertex without  intravenous contrast. COMPARISON:  None. FINDINGS: Brain: Acute intracranial hemorrhage. There is intra-axial hyperdense hemorrhage centered at the right deep gray matter nuclei, perhaps originating at the right thalamus with extension into the ventricular system. The intra-axial component of blood encompasses 35 x 40 x 37 millimeters for an estimated blood volume of 26 milliliters. There is surrounding edema with regional mass effect, including midline shift of 8 millimeters near the site of the hemorrhage. There is a moderate volume of intraventricular extension of blood, present in the right lateral ventricle, the 3rd and 4th ventricles.  Questionable mild lateral ventriculomegaly at this time. The temporal horns remain diminutive though. Confluent bilateral cerebral white matter hypodensity including involvement of the deep white matter capsules. There is a small chronic appearing infarct in the left cerebellar hemisphere. No other cortical encephalomalacia identified. Basilar cisterns remain patent. No superimposed acute cortically based infarct. Vascular: No suspicious intracranial vascular hyperdensity. Skull: No acute osseous abnormality identified. Hyperostosis, probably a normal variant. Sinuses/Orbits: Clear. Other: Disconjugate gaze. Otherwise negative orbit and scalp soft tissues. ASPECTS [facility] Stroke Program Early CT Score) Total score (0-10 with 10 being normal): Not applicable, acute hemorrhage. IMPRESSION: 1. Acute intracranial hemorrhage. Right deep gray matter/thalamic intra-axial hematoma estimated at 26 mL. Surrounding edema and regional mass effect including 8 millimeters of leftward midline shift. 2. Moderate volume of intraventricular extension of hemorrhage. Questionable mild subsequent ventriculomegaly. 3. 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 Small chronic left cerebellar infarct. Preliminary results were communicated to Dr. Otelia Limes at 11:47 amon 05-05-19by text page via the [facility] messaging system. Electronically Signed   By: Odessa Fleming M.D.   On: 06/06/2017 11:48   [place] Ekg Site Rite  Result Date: Jun 06, 2017 If Site Rite image not attached, placement could not be confirmed due to current cardiac rhythm.   Transthoracic Echocardiogram - pending 00/00/00   PHYSICAL EXAM  Temp:  [95.4 F (35.2 C)-99.1 F (37.3 C)] 99 F (37.2 C) (04/14 0400) Pulse Rate:  [77-111] 92 (04/14 0900) Resp:  [13-40] 24 (04/14 0900) BP: (98-212)/(59-139) 119/64 (04/14 0900) SpO2:  [89 %-100 %] 100 % (04/14 0900) Arterial Line BP: (91-181)/(51-84) 140/58 (04/14 0900) FiO2 (%):  [40 %-100 %] 40 % (04/14  0853) Weight:  [143 lb 11.8 oz (65.2 kg)] 143 lb 11.8 oz (65.2 kg) (04/13 1219)  General -cachectic, well developed, intubated.  Ophthalmologic - Fundi not visualized due to pinpoint pupils.  Cardiovascular - Regular rate and rhythm.  Neuro - intubated, on ventilation, eyes closed, not following commands.  With forced eye opening, eyes middle position, pupil bilateral pinpoint, nonreactive to light, sluggish doll's eyes, not blinking to visual threat bilaterally, not tracking objects, corneal reflex present, positive gag and cough.  On pain summation, right upper extremity proximal movement, left upper extremity posturing with intermittent mild withdraw to pain.  On pain summation bilateral lower extremity withdraw to pain however not able to against gravity.  DTR 1+, Babinski bilaterally positive. Sensation, coordination and gait not tested.   ASSESSMENT/PLAN Kendra Barry is a 52 y.o. female with unknown medical history presenting with left-sided weakness, confusion, left facial droop, nausea and vomiting with urine incontinence. .   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 cocaine use  Resultant intubated, left-sided weakness  CT head x 2 right large basal ganglia ICH with IVH  Repeat CT head 05/16/17 mild hydrocephalus and slightly increased midline shift to 8 mm  2D Echo - pending  LDL pending  HgbA1c 5.4  VTE prophylaxis -SCDs  Diet NPO time specified  Fall precautions  No antithrombotic prior to admission, now on No antithrombotic  Ongoing aggressive stroke risk factor management  Therapy recommendations:  pending  Disposition:  Pending  Cerebral edema  Midline shift CT had  On 3% saline at 75 cc/h  Intermittent 23.4% saline  Sodium goal 155-160  Sodium every 6h  Respiratory failure  Intubated for airway protection  CCM on board  On propofol  Obstructive hydrocephalus  Due to IVH involving lateral, third and  fourth ventricles  Neurosurgery on board  Status post EVD  EVD patent so far  Hypertensive emergency  Unstable . BP goal less than 140 . Maxalt on Cleviprex . Labetalol every hour PRN IV . Put on p.o. lisinopril and labetalol . Close monitoring  Cocaine abuse  UDS positive for cocaine  Cocaine cessation education will be provided  Other Stroke Risk Factors    Other Active Problems  Hyperglycemia  Hospital day # 1  This patient is critically ill due to large right BG hemorrhage, IVH, hydrocephalus, hypertensive emergency, respiratory failure and at significant risk of neurological worsening, death form brain herniation, cerebral edema, obstructive hydrocephalus, heart failure, seizure. This patient's care requires constant monitoring of vital signs, hemodynamics, respiratory and cardiac monitoring, review of multiple databases, neurological assessment, discussion with family, other specialists and medical decision making of high complexity. I spent 45 minutes of neurocritical care time in the care of this patient.  Marvel PlanJindong Leilyn Frayre, MD PhD Stroke Neurology 05/16/2017 11:10 AM    To contact Stroke Continuity provider, please refer to [email or web address]. After hours, contact General Neurology

## 2017-05-16 NOTE — Procedures (Signed)
Intubation Procedure Note Kendra Barry [id number] [birth year]  Procedure: Intubation Indications: Airway protection and maintenance  Procedure Details Consent: Unable to obtain consent because of altered level of consciousness. Time Out: Verified patient identification, verified procedure, site/side was marked, verified correct patient position, special equipment/implants available, medications/allergies/relevent history reviewed, required imaging and test results available.  Performed  Maximum sterile technique was used including gloves, hand hygiene and mask.  MAC and 4  Pt positioned with shoulder roll Hemodynamic monitoring throughout procedure Lidocaine 5cc Etomidate 20, Propofol 10 bolus and drip titrated to RASS -1 Video laryngoscopy used to visualize cords. Post ETT passing VC Rocuronium administered  Evaluation Hemodynamic Status: BP stable throughout; O2 sats: stable throughout Patient's Current Condition: stable Complications: No apparent complications Patient did tolerate procedure well. Chest X-ray ordered to verify placement.  CXR: tube position acceptable.   Kendra Barry 05/16/2017

## 2017-05-16 NOTE — Progress Notes (Signed)
PT Cancellation Note  Patient Details Name: Kendra Barry MRN: [id number] DOB: 10/02/1965   Cancelled Treatment:    Reason Eval/Treat Not Completed: Active bedrest order; will need incr activity orders to proceed with therapy evals; noted Patient is currently intubated and now weaning per nurse as well;  Will follow up later today as time allows;  Otherwise, will follow up for PT tomorrow;   Thank you,  Van ClinesHolly Marcoantonio Legault, PT  Acute Rehabilitation Services Pager [phone] Office [phone]     Levi AlandHolly H Tyreanna Bisesi 05/16/2017, 8:04 AM

## 2017-05-16 NOTE — Progress Notes (Signed)
SLP Cancellation Note  Patient Details Name: Kendra Barry MRN: [id number] DOB: 03/15/1965   Cancelled treatment:       Reason Eval/Treat Not Completed: Medical issues which prohibited therapy.  Patient is currently intubated and now weaning per nurse.  ST will follow up next date for readiness for evaluations.  Dimas AguasMelissa Mariette Cowley, MA, CCC-SLP Acute Rehab SLP [phone]  Fleet ContrasMelissa N Jisela Merlino 05/16/2017, 7:58 AM

## 2017-05-16 NOTE — Progress Notes (Signed)
OT Cancellation    05/16/17 0800  OT Visit Information  Last OT Received On 05/16/17  Reason Eval/Treat Not Completed Active bedrest order. Will await increase in activity orders prior to initiating OT evaluation. Thank you!   Isay Perleberg MSOT, OTR/L Acute Rehab Pager: [phone] Office: [phone]

## 2017-05-16 NOTE — Progress Notes (Signed)
Unfortunate 52 year old female with history of hypertension, cocaine abuse presents with large right thalamic/basal ganglia hemorrhage with IVH today around noon. She is admitted in the neurological ICU.  Patient has been progressively declining neurologically She is more lethargic and not following commands. To sternal rub she does verbalize her name, but does not answer any other questions. She does have a right downward gaze deviation, pupils are 2 mm and sluggish. Withdraws briskly on the right side, less on the left side noxious stimulus. Repeat CT head redemonstrated large right thalamic hemorrhage with IVH and midline shift of 7 mm with slightly worsening hydrocephalus.  [facility] and decision was made to intubate as we were concerned she was not protecting her airway. Spoke on the phone with her sister ( unable to reach daughter) and patient was intubated. Neurosurgery was consulted. She received 23% hypertonic saline and is on 3% hypertonic saline. Will attempt to get goal sodium 150-160.  As patient is critically ill and has high chance of mortality.   CRITICAL CARE Performed by: Dara LordsSushanth R Aroor   Total critical care time: 45 minutes  Critical care was necessary to treat neurologically crtically ill patient large subcortical hemorrhage with IVH with cerebral edema impending herniation.High risk of mortality.   Critical care was time spent personally by me on the following activities: development of treatment plan with patient and/or surrogate as well as nursing, discussions with consultants ( neurosurgery, PCCM), , examination of patient,  ordering and performing treatments and interventions, ordering and review of laboratory studies, ordering and review of radiographic studies.

## 2017-05-16 NOTE — Consult Note (Addendum)
Chief Complaint   Chief Complaint  Patient presents with  . Code Stroke    HPI   HPI: Kendra Barry is a 52 y.o. female who presented to ER yesterday afternoon as code stroke. Work up in ER was significant for head CT showing right thalamic/basal ganglia hemorrhage with 77m midline shift. Hemorrhage has been evolving with mild developing hydrocephalus. Patient has subsequently also become more lethargic. Neurosurgery was consulted by Dr Aroor for possible placement of EVD.  Patient does not have any family listed in chart. There is no family at bedside.  Patient Active Problem List   Diagnosis Date Noted  . ICH (intracerebral hemorrhage) ([place] 05/[place]    PMH: History reviewed. No pertinent past medical history.  PSH: History reviewed. No pertinent surgical history.  No medications prior to admission.    SH: Social History   Tobacco Use  . Smoking status: Not on file  Substance Use Topics  . Alcohol use: Not on file  . Drug use: Not on file    MEDS: Prior to Admission medications   Not on File    ALLERGY: Not on File  Social History   Tobacco Use  . Smoking status: Not on file  Substance Use Topics  . Alcohol use: Not on file     No family history on file.   ROS   ROS unable to obtain  Exam   Vitals:   05/16/17 0010 05/16/17 0013  BP:    Pulse:    Resp:    Temp:  99.1 F (37.3 C)  SpO2: 98%    Intubated On propofol [place] pupils Does not follow commands Flexes LE to painful stimulus B/L Flexes BUE to painful stimulus, L>R  Results - Imaging/Labs   Results for orders placed or performed during the hospital encounter of 05/11/2017 (from the past 48 hour(s))  Protime-INR     Status: None   Collection Time: 05/30/2017 11:20 AM  Result Value Ref Range   Prothrombin Time 13.3 11.4 - 15.2 seconds   INR 1.02     Comment: Performed at [facility] [address], [place] [id number] APTT     Status: None   Collection Time:  05/10/2017 11:20 AM  Result Value Ref Range   aPTT 25 24 - 36 seconds    Comment: Performed at [address], [id number] CBC     Status: None   Collection Time: 05/21/2017 11:20 AM  Result Value Ref Range   WBC 6.4 4.0 - 10.5 K/uL   RBC 4.84 3.87 - 5.11 MIL/uL   Hemoglobin 14.2 12.0 - 15.0 g/dL   HCT 42.6 36.0 - 46.0 %   MCV 88.0 78.0 - 100.0 fL   MCH 29.3 26.0 - 34.0 pg   MCHC 33.3 30.0 - 36.0 g/dL   RDW 12.5 11.5 - 15.5 %   Platelets 194 150 - 400 K/uL    Comment: Performed at [facility] [address], [place] [address] Differential     Status: None   Collection Time: 05/28/2017 11:20 AM  Result Value Ref Range   Neutrophils Relative % 46 %   Neutro Abs 2.9 1.7 - 7.7 K/uL   Lymphocytes Relative 44 %   Lymphs Abs 2.8 0.7 - 4.0 K/uL   Monocytes Relative 7 %   Monocytes Absolute 0.4 0.1 - 1.0 K/uL   Eosinophils Relative 3 %   Eosinophils Absolute 0.2  0.0 - 0.7 K/uL   Basophils Relative 0 %   Basophils Absolute 0.0 0.0 - 0.1 K/uL    Comment: Performed at [facility], [place] [address], [place], [place] [address]  Comprehensive metabolic panel     Status: Abnormal   Collection Time: 05/30/2017 11:20 AM  Result Value Ref Range   Sodium 138 135 - 145 mmol/L   Potassium 2.8 (L) 3.5 - 5.1 mmol/L   Chloride 103 101 - 111 mmol/L   CO2 26 22 - 32 mmol/L   Glucose, Bld 180 (H) 65 - 99 mg/dL   BUN 10 6 - 20 mg/dL   Creatinine, Ser 1.08 (H) 0.44 - 1.00 mg/dL   Calcium 9.4 8.9 - 10.3 mg/dL   Total Protein 7.6 6.5 - 8.1 g/dL   Albumin 4.0 3.5 - 5.0 g/dL   AST 22 15 - 41 U/L   ALT 13 (L) 14 - 54 U/L   Alkaline Phosphatase 87 38 - 126 U/L   Total Bilirubin 1.1 0.3 - 1.2 mg/dL   GFR calc non Af Amer 59 (L) >60 mL/min   GFR calc Af Amer >60 >60 mL/min    Comment: (NOTE) The eGFR has been calculated using the CKD EPI equation. This calculation has not been validated in all clinical situations. eGFR's persistently <60 mL/min  signify possible Chronic Kidney Disease.    Anion gap 9 5 - 15    Comment: Performed at [facility] [address] [place], [place] [address]  I-stat troponin, ED     Status: None   Collection Time: 05/10/2017 11:28 AM  Result Value Ref Range   Troponin i, poc 0.01 0.00 - 0.08 ng/mL   Comment 3            Comment: Due to the release kinetics of cTnI, a negative result within the first hours of the onset of symptoms does not rule out myocardial infarction with certainty. If myocardial infarction is still suspected, repeat the test at appropriate intervals.   I-Stat Chem 8, ED     Status: Abnormal   Collection Time: 05/27/2017 11:29 AM  Result Value Ref Range   Sodium 141 135 - 145 mmol/L   Potassium 2.8 (L) 3.5 - 5.1 mmol/L   Chloride 102 101 - 111 mmol/L   BUN 12 6 - 20 mg/dL   Creatinine, Ser 0.90 0.44 - 1.00 mg/dL   Glucose, Bld 172 (H) 65 - 99 mg/dL   Calcium, Ion 1.19 1.15 - 1.40 mmol/L   TCO2 27 22 - 32 mmol/L   Hemoglobin 15.0 12.0 - 15.0 g/dL   HCT 44.0 36.0 - 46.0 %  I-Stat beta hCG blood, ED     Status: None   Collection Time: 05/16/2017 11:29 AM  Result Value Ref Range   I-stat hCG, quantitative <5.0 <5 mIU/mL   Comment 3            Comment:   GEST. AGE      CONC.  (mIU/mL)   <=1 WEEK        5 - 50     2 WEEKS       50 - 500     3 WEEKS       100 - 10,000     4 WEEKS     1,000 - 30,000        FEMALE AND NON-PREGNANT FEMALE:     LESS THAN 5 mIU/mL   Ethanol     Status:  None   Collection Time: 05/07/2017 12:35 PM  Result Value Ref Range   Alcohol, Ethyl (B) <10 <10 mg/dL    Comment:        LOWEST DETECTABLE LIMIT FOR SERUM ALCOHOL IS 10 mg/dL FOR MEDICAL PURPOSES ONLY Performed at [facility], [place] [address], [place], [place] [address]   Urinalysis, Routine w reflex microscopic     Status: Abnormal   Collection Time: 05/26/2017  1:02 PM  Result Value Ref Range   Color, Urine STRAW (A) YELLOW   APPearance CLEAR CLEAR   Specific Gravity, Urine  1.010 1.005 - 1.030   pH 7.0 5.0 - 8.0   Glucose, UA 50 (A) NEGATIVE mg/dL   Hgb urine dipstick MODERATE (A) NEGATIVE   Bilirubin Urine NEGATIVE NEGATIVE   Ketones, ur NEGATIVE NEGATIVE mg/dL   Protein, ur 30 (A) NEGATIVE mg/dL   Nitrite NEGATIVE NEGATIVE   Leukocytes, UA NEGATIVE NEGATIVE   RBC / HPF 6-30 0 - 5 RBC/hpf   WBC, UA 0-5 0 - 5 WBC/hpf   Bacteria, UA NONE SEEN NONE SEEN   Squamous Epithelial / LPF NONE SEEN NONE SEEN   Mucus PRESENT     Comment: Performed at [facility], [place] [address] [place], [place] [address]  Urine rapid drug screen (hosp performed)     Status: Abnormal   Collection Time: 05/30/2017  1:02 PM  Result Value Ref Range   Opiates NONE DETECTED NONE DETECTED   Cocaine POSITIVE (A) NONE DETECTED   Benzodiazepines NONE DETECTED NONE DETECTED   Amphetamines NONE DETECTED NONE DETECTED   Tetrahydrocannabinol NONE DETECTED NONE DETECTED   Barbiturates NONE DETECTED NONE DETECTED    Comment: (NOTE) DRUG SCREEN FOR MEDICAL PURPOSES ONLY.  IF CONFIRMATION IS NEEDED FOR ANY PURPOSE, NOTIFY LAB WITHIN 5 DAYS. LOWEST DETECTABLE LIMITS FOR URINE DRUG SCREEN Drug Class                     Cutoff (ng/mL) Amphetamine and metabolites    1000 Barbiturate and metabolites    200 Benzodiazepine                 756 Tricyclics and metabolites     300 Opiates and metabolites        300 Cocaine and metabolites        300 THC                            50 Performed at [facility], [place] [address] [place], [place] [address]   Hemoglobin A1c     Status: None   Collection Time: 05/26/2017  2:02 PM  Result Value Ref Range   Hgb A1c MFr Bld 5.4 4.8 - 5.6 %    Comment: (NOTE) Pre diabetes:          5.7%-6.4% Diabetes:              >6.4% Glycemic control for   <7.0% adults with diabetes    Mean Plasma Glucose 108.28 mg/dL    Comment: Performed at [facility] [address] [place], [place] [address]  Sodium     Status: None   Collection Time:  05/13/2017  2:02 PM  Result Value Ref Range   Sodium 138 135 - 145 mmol/L    Comment: Performed at [facility] [address], [place], [place] [address]  Urinalysis, Complete w Microscopic  Status: Abnormal   Collection Time: 05/07/2017  2:31 PM  Result Value Ref Range   Color, Urine YELLOW YELLOW   APPearance CLEAR CLEAR   Specific Gravity, Urine 1.011 1.005 - 1.030   pH 7.0 5.0 - 8.0   Glucose, UA 50 (A) NEGATIVE mg/dL   Hgb urine dipstick MODERATE (A) NEGATIVE   Bilirubin Urine NEGATIVE NEGATIVE   Ketones, ur NEGATIVE NEGATIVE mg/dL   Protein, ur 30 (A) NEGATIVE mg/dL   Nitrite NEGATIVE NEGATIVE   Leukocytes, UA NEGATIVE NEGATIVE   RBC / HPF 0-5 0 - 5 RBC/hpf   WBC, UA 0-5 0 - 5 WBC/hpf   Bacteria, UA NONE SEEN NONE SEEN   Squamous Epithelial / LPF 0-5 (A) NONE SEEN   Mucus PRESENT    Hyaline Casts, UA PRESENT     Comment: Performed at [facility], [address] [place], [place] [address]  MRSA PCR Screening     Status: None   Collection Time: 05/06/2017  2:31 PM  Result Value Ref Range   MRSA by PCR NEGATIVE NEGATIVE    Comment:        The GeneXpert MRSA Assay (FDA approved for NASAL specimens only), is one component of a comprehensive MRSA colonization surveillance program. It is not intended to diagnose MRSA infection nor to guide or monitor treatment for MRSA infections. Performed at [facility], [place] [address], [place], [place] [address]   Sodium     Status: None   Collection Time: 05/18/2017  7:52 PM  Result Value Ref Range   Sodium 145 135 - 145 mmol/L    Comment: DELTA CHECK NOTED Performed at [facility], [place] [address] [place], [place] [address]     Ct Head Wo Contrast  Result Date: 05/28/2017 CLINICAL DATA:  Follow-up stroke. EXAM: CT HEAD WITHOUT CONTRAST TECHNIQUE: Contiguous axial images were obtained from the base of the skull through the vertex without intravenous contrast. COMPARISON:  CT HEAD May 15, 2017 at 1350  hours FINDINGS: BRAIN: 3.1 x 3.3 x 4 cm similar RIGHT thalamus intraparenchymal hemorrhage with surrounding low-density vasogenic edema. Intraventricular extension again noted with 7 mm stable RIGHT to LEFT subfalcine herniation. Slightly increasing hydrocephalus. Confluent supratentorial white matter hypodensities exclusive a aforementioned abnormality. Old small LEFT cerebellar infarct. Old suspected RIGHT basal ganglia infarct. No abnormal extra-axial fluid collections. Basal cisterns are patent. VASCULAR: Unremarkable. SKULL/SOFT TISSUES: No skull fracture. No significant soft tissue swelling. ORBITS/SINUSES: The included ocular globes and orbital contents are normal.Trace paranasal sinus mucosal thickening. Mastoid air cells are well aerated. OTHER: None. IMPRESSION: 1. Evolving acute RIGHT thalamus intraparenchymal hematoma with similar intraventricular extension. Similar 7 mm subfalcine herniation. 2. Slightly worsening mild hydrocephalus. Electronically Signed   By: Elon Alas M.D.   On: 05/09/2017 22:30   Ct Head Wo Contrast  Result Date: 05/04/2017 CLINICAL DATA:  Follow up cerebral hemorrhage. EXAM: CT HEAD WITHOUT CONTRAST TECHNIQUE: Contiguous axial images were obtained from the base of the skull through the vertex without intravenous contrast. COMPARISON:  Head CT from earlier same day. FINDINGS: Brain: The acute parenchymal hemorrhage centered in the RIGHT basal ganglia region is not significantly changed compared to the study performed earlier today, measuring 4 cm transverse dimension by 3.7 cm craniocaudal dimension (coronal series 5, image 41). The surrounding edema is also not significantly changed with associated mass effect and midline shift measuring 7 mm. Again noted is moderate to large amount of acute intraventricular hemorrhage, again most prominently  seen within the RIGHT lateral ventricle, also seen within the third ventricle and fourth ventricle, all of which appears stable.  Trace subarachnoid hemorrhage noted within the LEFT sylvian fissure, stable although better seen on the present exam. Mild ventricular prominence is stable. No evidence of transtentorial or tonsillar herniation. Confluent areas of low density are again seen throughout the bilateral periventricular and subcortical white matter regions consistent with chronic small vessel ischemic changes. Small old infarct again noted within the LEFT cerebellum with associated encephalomalacia. Vascular: No hyperdense vessel or unexpected calcification. Skull: Normal. Negative for fracture or focal lesion. Sinuses/Orbits: No acute finding. Other: None. IMPRESSION: 1. Acute intracranial hemorrhage, as detailed above, including the dominant parenchymal hemorrhage centered in the RIGHT basal ganglia which is stable in size and extent compared to the CT performed earlier today. Associated mass effect appears stable with 7 mm leftward midline shift. 2. Moderate amount of intraventricular hemorrhage appears stable. Associated mild ventricular dilatation. 3. Chronic small vessel ischemic changes within the white matter. Electronically Signed   By: Franki Cabot M.D.   On: 05/29/2017 14:24   Chest Port 1 View  Result Date: 06/01/2017 CLINICAL DATA:  Intracerebral hemorrhage, stroke-like symptoms. Incontinent of urine, vomiting. Seizure like activity. EXAM: PORTABLE CHEST 1 VIEW COMPARISON:  None. FINDINGS: Borderline cardiomegaly. Lungs are clear. No pleural effusion or pneumothorax seen. No acute or suspicious osseous finding. IMPRESSION: No active disease. No evidence of pneumonia, pleural effusion or pulmonary edema. Electronically Signed   By: Franki Cabot M.D.   On: 05/26/2017 14:14   Ct Head Code Stroke Wo Contrast  Result Date: 05/26/2017 CLINICAL DATA:  Code stroke. 52 year old female with altered mental status and incontinence. Last seen normal 0800 hours. EXAM: CT HEAD WITHOUT CONTRAST TECHNIQUE: Contiguous axial images  were obtained from the base of the skull through the vertex without intravenous contrast. COMPARISON:  None. FINDINGS: Brain: Acute intracranial hemorrhage. There is intra-axial hyperdense hemorrhage centered at the right deep gray matter nuclei, perhaps originating at the right thalamus with extension into the ventricular system. The intra-axial component of blood encompasses 35 x 40 x 37 millimeters for an estimated blood volume of 26 milliliters. There is surrounding edema with regional mass effect, including midline shift of 8 millimeters near the site of the hemorrhage. There is a moderate volume of intraventricular extension of blood, present in the right lateral ventricle, the 3rd and 4th ventricles. Questionable mild lateral ventriculomegaly at this time. The temporal horns remain diminutive though. Confluent bilateral cerebral white matter hypodensity including involvement of the deep white matter capsules. There is a small chronic appearing infarct in the left cerebellar hemisphere. No other cortical encephalomalacia identified. Basilar cisterns remain patent. No superimposed acute cortically based infarct. Vascular: No suspicious intracranial vascular hyperdensity. Skull: No acute osseous abnormality identified. Hyperostosis, probably a normal variant. Sinuses/Orbits: Clear. Other: Disconjugate gaze. Otherwise negative orbit and scalp soft tissues. ASPECTS [facility] Stroke Program Early CT Score) Total score (0-10 with 10 being normal): Not applicable, acute hemorrhage. IMPRESSION: 1. Acute intracranial hemorrhage. Right deep gray matter/thalamic intra-axial hematoma estimated at 26 mL. Surrounding edema and regional mass effect including 8 millimeters of leftward midline shift. 2. Moderate volume of intraventricular extension of hemorrhage. Questionable mild subsequent ventriculomegaly. 3. 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 Small chronic left cerebellar infarct. Preliminary  results were communicated to Dr. Cheral Marker at 11:47 amon 04/22/2019by text page via the [facility] messaging system. Electronically Signed   By: Genevie Ann M.D.   On: 05/29/2017 11:48  [place] Ekg Site Rite  Result Date: 05/20/2017 If Site Rite image not attached, placement could not be confirmed due to current cardiac rhythm.   Impression/Plan   52 y.o. female with evolving right thalamic hemorrhage with developing mild hydrocephalus likely secondary to HTN and cocaine abuse. Exam poor. Due to developing hydrocephalus, would like to place EVD. Was able to discuss situation with sister, Oley Balm, who is the only family we are able to get in touch with since start of hospitalization. I explained to her the situation. We discussed placement of EVD including the risks, benefits and alternatives. She states in own language understanding of the procedure and has asked that we proceed with placement.

## 2017-05-17 ENCOUNTER — Inpatient Hospital Stay (HOSPITAL_COMMUNITY): Payer: Medicaid Other

## 2017-05-17 DIAGNOSIS — J9601 Acute respiratory failure with hypoxia: Secondary | ICD-10-CM

## 2017-05-17 LAB — BASIC METABOLIC PANEL
BUN: 5 mg/dL — ABNORMAL LOW (ref 6–20)
BUN: 9 mg/dL (ref 6–20)
CO2: 19 mmol/L — ABNORMAL LOW (ref 22–32)
CO2: 19 mmol/L — ABNORMAL LOW (ref 22–32)
Calcium: 8.9 mg/dL (ref 8.9–10.3)
Calcium: 8.7 mg/dL — ABNORMAL LOW (ref 8.9–10.3)
Chloride: >130 mmol/L (ref 101–111)
Chloride: >130 mmol/L (ref 101–111)
Creatinine, Ser: 1.01 mg/dL — ABNORMAL HIGH (ref 0.44–1.00)
Creatinine, Ser: 1.05 mg/dL — ABNORMAL HIGH (ref 0.44–1.00)
GFR calc Af Amer: >60 mL/min (ref >60)
GLUCOSE: 152 mg/dL — AB (ref 65–99)
GLUCOSE: 147 mg/dL — AB (ref 65–99)
POTASSIUM: 3.4 mmol/L — AB (ref 3.5–5.1)
Potassium: 3.4 mmol/L — ABNORMAL LOW (ref 3.5–5.1)
SODIUM: 157 mmol/L — AB (ref 135–145)
Sodium: 160 mmol/L — ABNORMAL HIGH (ref 135–145)

## 2017-05-17 LAB — CBC
HCT: 32.8 % — ABNORMAL LOW (ref 36.0–46.0)
Hemoglobin: 10.6 g/dL — ABNORMAL LOW (ref 12.0–15.0)
MCH: 29.9 pg (ref 26.0–34.0)
MCHC: 32.3 g/dL (ref 30.0–36.0)
MCV: 92.4 fL (ref 78.0–100.0)
PLATELETS: 148 10*3/uL — AB (ref 150–400)
RBC: 3.55 MIL/uL — AB (ref 3.87–5.11)
RDW: 14 % (ref 11.5–15.5)
WBC: 9.2 10*3/uL (ref 4.0–10.5)

## 2017-05-17 LAB — GLUCOSE, CAPILLARY
GLUCOSE-CAPILLARY: 149 mg/dL — AB (ref 65–99)
Glucose-Capillary: 120 mg/dL — ABNORMAL HIGH (ref 65–99)
Glucose-Capillary: 158 mg/dL — ABNORMAL HIGH (ref 65–99)

## 2017-05-17 LAB — MAGNESIUM
Magnesium: 2.1 mg/dL (ref 1.7–2.4)
Magnesium: 2 mg/dL (ref 1.7–2.4)

## 2017-05-17 LAB — PHOSPHORUS
Phosphorus: 2.9 mg/dL (ref 2.5–4.6)
Phosphorus: 2.1 mg/dL — ABNORMAL LOW (ref 2.5–4.6)

## 2017-05-17 LAB — LIPID PANEL
Cholesterol: 149 mg/dL (ref 0–200)
HDL: 42 mg/dL (ref >40)
LDL Cholesterol: 73 mg/dL (ref 0–99)
TRIGLYCERIDES: 170 mg/dL — AB (ref <150)
Total CHOL/HDL Ratio: 3.5 RATIO
VLDL: 34 mg/dL (ref 0–40)

## 2017-05-17 LAB — ECHOCARDIOGRAM COMPLETE
HEIGHTINCHES: 63 in
WEIGHTICAEL: 2299.84 [oz_av]

## 2017-05-17 LAB — SODIUM
SODIUM: 162 mmol/L — AB (ref 135–145)
Sodium: 160 mmol/L — ABNORMAL HIGH (ref 135–145)
Sodium: 158 mmol/L — ABNORMAL HIGH (ref 135–145)

## 2017-05-17 MED ORDER — VITAL HIGH PROTEIN PO LIQD
1000.0000 mL | ORAL | Status: DC
  Administered 2017-05-17 – 2017-05-19 (×2): 1000 mL

## 2017-05-17 MED ORDER — POTASSIUM CHLORIDE 10 MEQ/50ML IV SOLN
10.0000 meq | INTRAVENOUS | Status: AC
  Administered 2017-05-17 (×5): 10 meq via INTRAVENOUS
  Filled 2017-05-17 (×5): qty 50

## 2017-05-17 MED ORDER — VITAL HIGH PROTEIN PO LIQD: 1000.0000 mL | ORAL | Status: DC

## 2017-05-17 MED ORDER — SODIUM BICARBONATE 650 MG PO TABS
325.0000 mg | ORAL_TABLET | Freq: Two times a day (BID) | ORAL | Status: DC
  Administered 2017-05-17 – 2017-05-22 (×11): 325 mg
  Filled 2017-05-17 (×11): qty 1

## 2017-05-17 MED ORDER — FAMOTIDINE IN NACL 20-0.9 MG/50ML-% IV SOLN
20.0000 mg | Freq: Two times a day (BID) | INTRAVENOUS | Status: DC
  Administered 2017-05-17 – 2017-05-22 (×11): 20 mg via INTRAVENOUS
  Filled 2017-05-17 (×11): qty 50

## 2017-05-17 MED ORDER — LABETALOL HCL 5 MG/ML IV SOLN
10.0000 mg | INTRAVENOUS | Status: DC | PRN
  Administered 2017-05-17 – 2017-05-18 (×7): 10 mg via INTRAVENOUS
  Filled 2017-05-17 (×6): qty 4

## 2017-05-17 MED ORDER — PRO-STAT SUGAR FREE PO LIQD
30.0000 mL | Freq: Two times a day (BID) | ORAL | Status: DC
  Administered 2017-05-17: 30 mL
  Filled 2017-05-17: qty 30

## 2017-05-17 MED ORDER — METOPROLOL TARTRATE 5 MG/5ML IV SOLN
INTRAVENOUS | Status: AC
  Filled 2017-05-17: qty 5

## 2017-05-17 MED ORDER — CHLORHEXIDINE GLUCONATE 0.12 % MT SOLN
OROMUCOSAL | Status: AC
  Administered 2017-05-17: 15 mL
  Filled 2017-05-17: qty 15

## 2017-05-17 NOTE — Progress Notes (Signed)
OT Cancellation Note  Patient Details Name: Kendra Barry MRN: [id number] DOB: 02/02/1966   Cancelled Treatment:    Reason Eval/Treat Not Completed: Medical issues which prohibited therapy. Pt intubated, sedated, and on bedrest. Will check back as appropriate.   Doristine Sectionharity A Keyen Marban, MS OTR/L  Pager: [phone]   Tyvon Eggenberger A Briyana Badman 05/17/2017, 7:40 AM

## 2017-05-17 NOTE — Progress Notes (Signed)
Chaplain visited with family at the request of the nurse.  The PT family member stated they were okay and their pastor was on their way to the hospital.

## 2017-05-17 NOTE — Progress Notes (Signed)
SLP Cancellation Note  Patient Details Name: Kendra Barry MRN: [id number] DOB: 12/15/1965   Cancelled treatment:       Reason Eval/Treat Not Completed: Patient not medically ready   Demarlo Riojas, Riley NearingBonnie Caroline 05/17/2017, 7:59 AM

## 2017-05-17 NOTE — Progress Notes (Signed)
PT Cancellation Note  Patient Details Name: Kendra Barry MRN: [id number] DOB: 01/25/1966   Cancelled Treatment:    Reason Eval/Treat Not Completed: Medical issues which prohibited therapy(intubated on bedrest)   Toryn Mcclinton B Eusebio Blazejewski 05/17/2017, 7:11 AM  Delaney MeigsMaija Tabor Shota Kohrs, PT [phone]

## 2017-05-17 NOTE — Progress Notes (Signed)
RT NOTE:  Pt transported to CT and back to 4N27 without complication.

## 2017-05-17 NOTE — Progress Notes (Signed)
No issues overnight.   EXAM:  BP 132/74   Pulse 91   Temp 99.9 F (37.7 C) (Axillary)   Resp (!) 24   Ht 5\' 3"  (1.6 m)   Wt 65.2 kg (143 lb 11.8 oz)   LMP  (LMP Unknown)   SpO2 100%   BMI 25.46 kg/m   No eye opening W/d RUE/RLE  Extends LUE EVD in place, minimal output but fluctuating meniscus  IMPRESSION:  52 y.o. female with right thalamic hemorrhage, IVH.  With low opening pressure and minimal output, unlikely to have HCP  PLAN: - cont mgmt per neurology/PCCM - will keep EVD for today, may try to remove later this week.

## 2017-05-17 NOTE — Progress Notes (Signed)
PULMONARY / CRITICAL CARE MEDICINE   Name: Kendra Barry MRN: [id number] DOB: [birth year]    ADMISSION DATE:  05/30/17   HISTORY OF PRESENT ILLNESS: This is a 52 year old who presented with left hemiparesis on 4/13.  Who was found to have a right basal ganglia hemorrhage with extension into the ventricular system.  She developed mass effect from the bleed and was placed on hypertonic saline.  An EVD was placed.  Screen was positive for cocaine She remains intubated and mechanically ventilated.  She is still on Cleviprex infusion for control of her blood pressure.  She is sedated with 40 mcg of propofol at the time of my exam      PAST MEDICAL HISTORY :  She  has no past medical history on file.  I spoke with her daughter who reports no known significant past medical history other than a suspicion of hypertension for which she takes no medications   PAST SURGICAL HISTORY: She  has no past surgical history on file.  Not on File  No current facility-administered medications on file prior to encounter.    No current outpatient medications on file prior to encounter.    FAMILY HISTORY:  Her has no family status information on file.    SOCIAL HISTORY: She    REVIEW OF SYSTEMS:   Unobtainable  SUBJECTIVE:  As above  VITAL SIGNS: BP (!) 173/65   Pulse 83   Temp 98.3 F (36.8 C) (Axillary)   Resp (!) 24   Ht 5\' 3"  (1.6 m)   Wt 143 lb 11.8 oz (65.2 kg)   LMP  (LMP Unknown)   SpO2 100%   BMI 25.46 kg/m   HEMODYNAMICS:    VENTILATOR SETTINGS: Vent Mode: PRVC FiO2 (%):  [40 %] 40 % Set Rate:  [24 bmp] 24 bmp Vt Set:  [420 mL] 420 mL PEEP:  [5 cmH20] 5 cmH20 Plateau Pressure:  [14 cmH20-18 cmH20] 15 cmH20  INTAKE / OUTPUT: I/O last 3 completed shifts: In: 4760.8 [I.V.:4760.8] Out: 2930 [Urine:2925; Drains:5]  PHYSICAL EXAMINATION: General: Intubated and in no distress. Neuro: Response to voice, spontaneously moving the left side, some movement on the right but  much less active.  Downward gaze bilaterally. Cardiovascular: S1 and S2 are regular without murmur rub or gallop Lungs: Respirations are unlabored there is symmetric air movement, no wheezes. Abdomen: The abdomen is soft without any organomegaly masses or tenderness. Musculoskeletal: No dependent edema   LABS:  BMET Recent Labs  Lab 05/16/17 1638  05/16/17 2137 05/17/17 0250 05/17/17 0741  NA 153*   < > 156* 157* 162*  K 3.4*  --  3.4* 3.4*  --   CL 126*  --  >130* >130*  --   CO2 20*  --  20* 19*  --   BUN <5*  --  5* 5*  --   CREATININE 0.91  --  0.92 1.01*  --   GLUCOSE 140*  --  141* 152*  --    < > = values in this interval not displayed.    Electrolytes Recent Labs  Lab 05/16/17 1638 05/16/17 2137 05/17/17 0250  CALCIUM 8.8* 8.8* 8.9    CBC Recent Labs  Lab 05-30-2017 1120 2017-05-30 1129 05/17/17 0600  WBC 6.4  --  9.2  HGB 14.2 15.0 10.6*  HCT 42.6 44.0 32.8*  PLT 194  --  148*    Coag's Recent Labs  Lab May 30, 2017 1120  APTT 25  INR 1.02    Sepsis  Markers No results for input(s): LATICACIDVEN, PROCALCITON, O2SATVEN in the last 168 hours.  ABG Recent Labs  Lab 05/16/17 0040 05/16/17 1122  PHART 7.388 7.358  PCO2ART 34.8 39.9  PO2ART 414* 160*    Liver Enzymes Recent Labs  Lab 05/28/2017 1120  AST 22  ALT 13*  ALKPHOS 87  BILITOT 1.1  ALBUMIN 4.0    Cardiac Enzymes No results for input(s): TROPONINI, PROBNP in the last 168 hours.  Glucose No results for input(s): GLUCAP in the last 168 hours.  Imaging Ct Head Wo Contrast  Result Date: 05/17/2017 CLINICAL DATA:  Followup intracranial hemorrhage. EXAM: CT HEAD WITHOUT CONTRAST TECHNIQUE: Contiguous axial images were obtained from the base of the skull through the vertex without intravenous contrast. COMPARISON:  CT HEAD May 16, 2017 FINDINGS: BRAIN: RIGHT ventriculostomy catheter distal tip traversing the RIGHT frontal horn of the lateral ventricle terminating near the circle of  Willis. Stable mild hydrocephalus. Small amount of subarachnoid blood products and trace extra-axial pneumocephalus along catheter tract. Similar volume intraventricular blood products. Evolving large RIGHT thalamus intraparenchymal hematoma with regional mass effect and vasogenic edema. 7 mm RIGHT to LEFT midline shift, relatively unchanged. Old suspected RIGHT basal ganglia infarct. Old small LEFT cerebellar infarct. Confluent supratentorial white matter hypodensities are similar. No acute large vascular territory infarcts. Basal cisterns remain patent. VASCULAR: Unremarkable. SKULL/SOFT TISSUES: No skull fracture. Three RIGHT frontal burr holes. Mild RIGHT frontal scalp soft tissue swelling with subcutaneous gas and overlying skin staple. ORBITS/SINUSES: The included ocular globes and orbital contents are normal.The mastoid aircells and included paranasal sinuses are well-aerated. OTHER: None. IMPRESSION: 1. Stable position of RIGHT EVD with stable mild hydrocephalus and intraventricular hemorrhage. 2. Evolving RIGHT thalamic hematoma with regional mass effect and 7 mm RIGHT to LEFT midline shift. Electronically Signed   By: Awilda Metroourtnay  Bloomer M.D.   On: 05/17/2017 05:12       DISCUSSION:      This is a 52 year old who presented with hemiparesis and was found to have a ganglia hemorrhage associated with dream hypertension.  Toxicology screen was positive for cocaine.  ASSESSMENT / PLAN:  PULMONARY A: She is intubated at present solely for airway control.   NEUROLOGIC A: Right basal ganglia hemorrhage with ventricular extension.  EVD is not draining at +10.  Continue hypertensive control with a combination of lisinopril and titrated Cleviprex.  Hypertonic saline has been administered to goal.     Kendra PiaWJ Josafat Enrico, MD Critical Care Medicine [facility] Pager: [phone]  05/17/2017, 9:53 AM

## 2017-05-17 NOTE — Progress Notes (Signed)
CRITICAL VALUE ALERT  Critical Value:  Na 162  Date & Time Notied:  4/15 0840 verbal  Provider Notified: Stroke MD  Orders Received/Actions taken: Stop 3%

## 2017-05-17 NOTE — Progress Notes (Signed)
  Echocardiogram 2D Echocardiogram has been performed.  Janalyn HarderWest, Teneisha Gignac R 05/17/2017, 12:17 PM

## 2017-05-17 NOTE — Progress Notes (Signed)
Initial Nutrition Assessment  DOCUMENTATION CODES:   Not applicable  INTERVENTION:   - Vital High Protein @ 50 ml/hr (1200 ml/day) - Free water flushes per MD  Tube feeding regimen provides 1200 kcal, 105 grams of protein (105% estimated protein needs), and 1008 ml of H2O.  Tube feeding regimen and current propofol provides 1612 total kcal (101% of estimated energy needs)  NUTRITION DIAGNOSIS:   Inadequate oral intake related to inability to eat as evidenced by NPO status.   GOAL:   Patient will meet greater than or equal to 90% of their needs  MONITOR:   Vent status, Labs, I & O's, TF tolerance, Weight trends  REASON FOR ASSESSMENT:   Ventilator, Consult Enteral/tube feeding initiation and management  ASSESSMENT:   52 year old female who arrived to the ED code stroke. PMH significant for HTN and cocaine abuse. 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  05/08/2017 - CT of head revealed a large right basal ganglia acute hemorrhage with IVF, PICC like placed 05/16/17 - EVD placed, repeat CT of head showed mild hydrocephalus, intubated  Spoke with pt's sister who reports pt is a "crack addict" and usually ate sporadically. Pt's sister has noticed that pt has lost weight but is unsure how much or what her baseline is.  Patient is currently intubated on ventilator support. Pt with OGT tip and side-port below the diaphragm. MV: 10.0 L/min Temp (24hrs), Avg:99.3 F (37.4 C), Min:98.3 F (36.8 C), Max:99.9 F (37.7 C)  Propofol: 15.6 ml/hr (provides 412 kcal/day)  UOP: 1575 ml x 24 hours  Medications reviewed and include: 20 mg lisinopril BID, Senokot-S BID, 325 sodium bicarb BID, 20 mg IV Pepcid BID, 10 mEq potassium chloride x 5 runs today  Labs reviewed: sodium 162 (H), chloride > 130 (H), potassium 3.4 (L), BUN 5 (L), creatinine 1.01 (H), triglycerides 170 (H), hemoglobin 10.6 (L), HCT 32.8 (L), hemoglobin A1C 5.4 Drips:  Cleviprex  NUTRITION - FOCUSED PHYSICAL EXAM:    Most Recent Value  Orbital Region  No depletion  Upper Arm Region  Mild depletion  Thoracic and Lumbar Region  No depletion  Buccal Region  Unable to assess  Temple Region  No depletion  Clavicle Bone Region  No depletion  Clavicle and Acromion Bone Region  Mild depletion  Scapular Bone Region  Unable to assess  Dorsal Hand  Unable to assess  Patellar Region  Moderate depletion  Anterior Thigh Region  Moderate depletion  Posterior Calf Region  No depletion  Edema (RD Assessment)  None  Hair  Reviewed  Eyes  Unable to assess  Mouth  Unable to assess  Skin  Reviewed  Nails  Unable to assess       Diet Order:  Diet NPO time specified Fall precautions  EDUCATION NEEDS:   No education needs have been identified at this time  Skin:  Skin Assessment: Reviewed RN Assessment  Last BM:  unknown/PTA  Height:   Ht Readings from Last 1 Encounters:  05/09/2017 5\' 3"  (1.6 m)    Weight:   Wt Readings from Last 1 Encounters:  05/13/2017 143 lb 11.8 oz (65.2 kg)    Ideal Body Weight:  52.3 kg  BMI:  Body mass index is 25.46 kg/m.  Estimated Nutritional Needs:   Kcal:  1589 kcal/day (PSU 2003b)  Protein:  80-100 grams/day (1.2-1.5 g/kg)  Fluid:  >/= 2.0 L/day    Earma ReadingKate Jablonski Tegan Burnside, MS, RD, LDN Pager: [phone] Weekend/After Hours: [phone]

## 2017-05-17 NOTE — Progress Notes (Addendum)
STROKE TEAM PROGRESS NOTE   SUBJECTIVE (INTERVAL HISTORY) Remains intubated and sedated on propofol. Also on cleviprex drip. EVD in place but unclear if alleviating pressure. Blood pressure remains greater than 140. 3% on hold given sodium 162. Patient homeless prior to admission. Daughter at bedside. She had another younger daughter. Dr. Pearlean BrownieSethi discussed with her diagnosis and current treatment.    OBJECTIVE Temp:  [98.3 F (36.8 C)-99.8 F (37.7 C)] 98.3 F (36.8 C) (04/15 0400) Pulse Rate:  [79-107] 83 (04/15 0800) Resp:  [19-25] 24 (04/15 0800) BP: (104-182)/(55-87) 173/65 (04/15 0927) SpO2:  [96 %-100 %] 100 % (04/15 0927) Arterial Line BP: (107-315)/(51-200) 122/51 (04/15 0800) FiO2 (%):  [40 %] 40 % (04/15 0927)  CBC:  Recent Labs  Lab 2017-10-03 1120 2017-10-03 1129 05/17/17 0600  WBC 6.4  --  9.2  NEUTROABS 2.9  --   --   HGB 14.2 15.0 10.6*  HCT 42.6 44.0 32.8*  MCV 88.0  --  92.4  PLT 194  --  148*    Basic Metabolic Panel:  Recent Labs  Lab 05/16/17 2137 05/17/17 0250 05/17/17 0741  NA 156* 157* 162*  K 3.4* 3.4*  --   CL >130* >130*  --   CO2 20* 19*  --   GLUCOSE 141* 152*  --   BUN 5* 5*  --   CREATININE 0.92 1.01*  --   CALCIUM 8.8* 8.9  --     Lipid Panel:     Component Value Date/Time   CHOL 149 05/17/2017 0430   TRIG 170 (H) 05/17/2017 0430   HDL 42 05/17/2017 0430   CHOLHDL 3.5 05/17/2017 0430   VLDL 34 05/17/2017 0430   LDLCALC 73 05/17/2017 0430   HgbA1c:  Lab Results  Component Value Date   HGBA1C 5.4 May 27, 2017   Urine Drug Screen:     Component Value Date/Time   LABOPIA NONE DETECTED May 27, 2017 1302   COCAINSCRNUR POSITIVE (A) May 27, 2017 1302   LABBENZ NONE DETECTED May 27, 2017 1302   AMPHETMU NONE DETECTED May 27, 2017 1302   THCU NONE DETECTED May 27, 2017 1302   LABBARB NONE DETECTED May 27, 2017 1302    Alcohol Level     Component Value Date/Time   ETH <10 May 27, 2017 1235    IMAGING Ct Head Wo Contrast  Result Date:  05/17/2017 CLINICAL DATA:  Followup intracranial hemorrhage. EXAM: CT HEAD WITHOUT CONTRAST TECHNIQUE: Contiguous axial images were obtained from the base of the skull through the vertex without intravenous contrast. COMPARISON:  CT HEAD May 16, 2017 FINDINGS: BRAIN: RIGHT ventriculostomy catheter distal tip traversing the RIGHT frontal horn of the lateral ventricle terminating near the circle of Willis. Stable mild hydrocephalus. Small amount of subarachnoid blood products and trace extra-axial pneumocephalus along catheter tract. Similar volume intraventricular blood products. Evolving large RIGHT thalamus intraparenchymal hematoma with regional mass effect and vasogenic edema. 7 mm RIGHT to LEFT midline shift, relatively unchanged. Old suspected RIGHT basal ganglia infarct. Old small LEFT cerebellar infarct. Confluent supratentorial white matter hypodensities are similar. No acute large vascular territory infarcts. Basal cisterns remain patent. VASCULAR: Unremarkable. SKULL/SOFT TISSUES: No skull fracture. Three RIGHT frontal burr holes. Mild RIGHT frontal scalp soft tissue swelling with subcutaneous gas and overlying skin staple. ORBITS/SINUSES: The included ocular globes and orbital contents are normal.The mastoid aircells and included paranasal sinuses are well-aerated. OTHER: None. IMPRESSION: 1. Stable position of RIGHT EVD with stable mild hydrocephalus and intraventricular hemorrhage. 2. Evolving RIGHT thalamic hematoma with regional mass effect and 7 mm RIGHT to LEFT  midline shift. Electronically Signed   By: Awilda Metro M.D.   On: 05/17/2017 05:12   Ct Head Wo Contrast  Result Date: 05/16/2017 CLINICAL DATA:  52 year old female who presented yesterday with acute right deep gray matter nuclei hemorrhage and intraventricular extension. EXAM: CT HEAD WITHOUT CONTRAST TECHNIQUE: Contiguous axial images were obtained from the base of the skull through the vertex without intravenous contrast.  COMPARISON:  May 18, 2017 head CTs. FINDINGS: Brain: A right superior approach external ventricular drain has been placed and courses through the anterior right lateral ventricle between the frontal horn and foramen of Monro. The drain terminates at the right inferior frontal gyrus. There is trace gas and subarachnoid blood along the course of the catheter. Trace additional pneumocephalus over the right superior convexity. Ventricle size appears stable since yesterday. A moderate volume of intraventricular hemorrhage is stable. Estimated intra-axial hemorrhage size centered at the right deep gray matter nuclei is 32 x 40 x 37 millimeters (AP by transverse by CC), for an estimated blood volume of 24 mL which is stable since the presentation CT yesterday. Leftward midline shift is stable at 8 millimeters. Basilar cistern patency is stable since yesterday. Stable gray-white matter differentiation elsewhere throughout the brain. Vascular: No suspicious intracranial vascular hyperdensity. Skull: Several small craniotomy holes along the right superior frontal bone now. Elsewhere the skull remains intact. Sinuses/Orbits: Remain clear. Other: Visualized orbit soft tissues are within normal limits. Mild postoperative changes to the right vertex scalp soft tissues. IMPRESSION: 1. Right superior frontal approach EVD placed, coursing through the anterior portion of the right lateral ventricle. Trace subarachnoid hemorrhage along the course of the catheter. Trace pneumocephalus. 2. Stable ventricle size and moderate volume intraventricular hemorrhage since 2218 hours yesterday. 3. Estimated intra-axial blood volume centered at the right deep gray matter is stable since the presentation CT yesterday. Surrounding edema and regional mass effect including leftward midline shift of 8 millimeters remain stable. Electronically Signed   By: Odessa Fleming M.D.   On: 05/16/2017 09:35   Ct Head Wo Contrast  Result Date: 2017/05/18 CLINICAL  DATA:  Follow-up stroke. EXAM: CT HEAD WITHOUT CONTRAST TECHNIQUE: Contiguous axial images were obtained from the base of the skull through the vertex without intravenous contrast. COMPARISON:  CT HEAD 2017/05/18 at 1350 hours FINDINGS: BRAIN: 3.1 x 3.3 x 4 cm similar RIGHT thalamus intraparenchymal hemorrhage with surrounding low-density vasogenic edema. Intraventricular extension again noted with 7 mm stable RIGHT to LEFT subfalcine herniation. Slightly increasing hydrocephalus. Confluent supratentorial white matter hypodensities exclusive a aforementioned abnormality. Old small LEFT cerebellar infarct. Old suspected RIGHT basal ganglia infarct. No abnormal extra-axial fluid collections. Basal cisterns are patent. VASCULAR: Unremarkable. SKULL/SOFT TISSUES: No skull fracture. No significant soft tissue swelling. ORBITS/SINUSES: The included ocular globes and orbital contents are normal.Trace paranasal sinus mucosal thickening. Mastoid air cells are well aerated. OTHER: None. IMPRESSION: 1. Evolving acute RIGHT thalamus intraparenchymal hematoma with similar intraventricular extension. Similar 7 mm subfalcine herniation. 2. Slightly worsening mild hydrocephalus. Electronically Signed   By: Awilda Metro M.D.   On: May 18, 2017 22:30   Ct Head Wo Contrast  Result Date: 2017-05-18 CLINICAL DATA:  Follow up cerebral hemorrhage. EXAM: CT HEAD WITHOUT CONTRAST TECHNIQUE: Contiguous axial images were obtained from the base of the skull through the vertex without intravenous contrast. COMPARISON:  Head CT from earlier same day. FINDINGS: Brain: The acute parenchymal hemorrhage centered in the RIGHT basal ganglia region is not significantly changed compared to the study performed earlier today, measuring 4  cm transverse dimension by 3.7 cm craniocaudal dimension (coronal series 5, image 41). The surrounding edema is also not significantly changed with associated mass effect and midline shift measuring 7 mm. Again  noted is moderate to large amount of acute intraventricular hemorrhage, again most prominently seen within the RIGHT lateral ventricle, also seen within the third ventricle and fourth ventricle, all of which appears stable. Trace subarachnoid hemorrhage noted within the LEFT sylvian fissure, stable although better seen on the present exam. Mild ventricular prominence is stable. No evidence of transtentorial or tonsillar herniation. Confluent areas of low density are again seen throughout the bilateral periventricular and subcortical white matter regions consistent with chronic small vessel ischemic changes. Small old infarct again noted within the LEFT cerebellum with associated encephalomalacia. Vascular: No hyperdense vessel or unexpected calcification. Skull: Normal. Negative for fracture or focal lesion. Sinuses/Orbits: No acute finding. Other: None. IMPRESSION: 1. Acute intracranial hemorrhage, as detailed above, including the dominant parenchymal hemorrhage centered in the RIGHT basal ganglia which is stable in size and extent compared to the CT performed earlier today. Associated mass effect appears stable with 7 mm leftward midline shift. 2. Moderate amount of intraventricular hemorrhage appears stable. Associated mild ventricular dilatation. 3. Chronic small vessel ischemic changes within the white matter. Electronically Signed   By: Bary Richard M.D.   On: 23-May-2017 14:24   Dg Chest Port 1 View  Result Date: 05/16/2017 CLINICAL DATA:  Endotracheal and OG tube placement. EXAM: PORTABLE CHEST 1 VIEW COMPARISON:  Radiograph earlier this day. FINDINGS: Endotracheal tube 4.1 cm from the carina. Enteric tube in place with tip and side-port below the diaphragm. Right upper extremity central line with tip in the distal SVC. No pneumothorax. Heart is normal in size. No pulmonary edema, pleural effusion or pneumothorax. IMPRESSION: 1. Endotracheal tube 4.1 cm from the carina. Tip and side port of the enteric  tube below the diaphragm. Right upper extremity PICC tip in the distal SVC. 2.  No acute pulmonary process. Electronically Signed   By: Rubye Oaks M.D.   On: 05/16/2017 00:56   Chest Port 1 View  Result Date: 2017/05/23 CLINICAL DATA:  Intracerebral hemorrhage, stroke-like symptoms. Incontinent of urine, vomiting. Seizure like activity. EXAM: PORTABLE CHEST 1 VIEW COMPARISON:  None. FINDINGS: Borderline cardiomegaly. Lungs are clear. No pleural effusion or pneumothorax seen. No acute or suspicious osseous finding. IMPRESSION: No active disease. No evidence of pneumonia, pleural effusion or pulmonary edema. Electronically Signed   By: Bary Richard M.D.   On: 2017/05/23 14:14   Ct Head Code Stroke Wo Contrast  Result Date: 05/23/17 CLINICAL DATA:  Code stroke. 52 year old female with altered mental status and incontinence. Last seen normal 0800 hours. EXAM: CT HEAD WITHOUT CONTRAST TECHNIQUE: Contiguous axial images were obtained from the base of the skull through the vertex without intravenous contrast. COMPARISON:  None. FINDINGS: Brain: Acute intracranial hemorrhage. There is intra-axial hyperdense hemorrhage centered at the right deep gray matter nuclei, perhaps originating at the right thalamus with extension into the ventricular system. The intra-axial component of blood encompasses 35 x 40 x 37 millimeters for an estimated blood volume of 26 milliliters. There is surrounding edema with regional mass effect, including midline shift of 8 millimeters near the site of the hemorrhage. There is a moderate volume of intraventricular extension of blood, present in the right lateral ventricle, the 3rd and 4th ventricles. Questionable mild lateral ventriculomegaly at this time. The temporal horns remain diminutive though. Confluent bilateral cerebral white matter hypodensity including  involvement of the deep white matter capsules. There is a small chronic appearing infarct in the left cerebellar  hemisphere. No other cortical encephalomalacia identified. Basilar cisterns remain patent. No superimposed acute cortically based infarct. Vascular: No suspicious intracranial vascular hyperdensity. Skull: No acute osseous abnormality identified. Hyperostosis, probably a normal variant. Sinuses/Orbits: Clear. Other: Disconjugate gaze. Otherwise negative orbit and scalp soft tissues. ASPECTS [facility] Stroke Program Early CT Score) Total score (0-10 with 10 being normal): Not applicable, acute hemorrhage. IMPRESSION: 1. Acute intracranial hemorrhage. Right deep gray matter/thalamic intra-axial hematoma estimated at 26 mL. Surrounding edema and regional mass effect including 8 millimeters of leftward midline shift. 2. Moderate volume of intraventricular extension of hemorrhage. Questionable mild subsequent ventriculomegaly. 3. 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 Small chronic left cerebellar infarct. Preliminary results were communicated to Dr. Otelia Limes at 11:47 amon 04/25/2019by text page via the [facility] messaging system. Electronically Signed   By: Odessa Fleming M.D.   On: 05/06/2017 11:48   [place] Ekg Site Rite  Result Date: 05/15/2017 If Site Rite image not attached, placement could not be confirmed due to current cardiac rhythm.   Transthoracic Echocardiogram  - Left ventricle: The cavity size was normal. There was severe   concentric hypertrophy. Systolic function was vigorous. The   estimated ejection fraction was in the range of 65% to 70%. There   was dynamic obstruction at restin the mid cavity, with mid-cavity   obliteration and a peak gradient of 46 mm Hg. Wall motion was   normal; there were no regional wall motion abnormalities.   Features are consistent with a pseudonormal left ventricular   filling pattern, with concomitant abnormal relaxation and   increased filling pressure (grade 2 diastolic dysfunction).   Doppler parameters are consistent with high ventricular filling    pressure.   PHYSICAL EXAM General -cachectic, well developed, intubated. Cardiovascular - Regular rate and rhythm.  Neuro - intubated, on ventilation, eyes closed, not following commands.  With forced eye opening, eyes with downward gaze to the left, pupil bilateral pinpoint, nonreactive to light, sluggish doll's eyes, not blinking to visual threat bilaterally, not tracking objects, corneal reflex present, positive gag and cough.  On pain summation, right upper extremity proximal movement, left upper extremity posturing with intermittent mild withdraw to pain.  On pain summation right greater than left lower extremity withdraw to pain however not able to against gravity.  DTR 1+, Babinski bilaterally positive. Sensation, coordination and gait not tested. R EVD   ASSESSMENT/PLAN Ms. Kendra Barry is a 52 y.o. female with unknown medical history presenting with left-sided weakness, confusion, left facial droop, nausea and vomiting with urine incontinence. .   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 No source of embolus   LDL 73  HgbA1c 5.4  VTE prophylaxis -SCDs Diet NPO time specified Fall precautions  No antithrombotic prior to admission, now on No antithrombotic  Ongoing aggressive stroke risk factor management  Therapy recommendations:  pending  Disposition:  Pending   Cerebral edema  Midline shift CT had  Intermittent 23.4% saline  On 3% saline at 75 cc/h  Na 162 this am. 3% on hold now  Sodium goal 150-155  Sodium every 6h  Allows sodium to drift  down. Resume 3% if drifts less than 150  Respiratory failure  Intubated for airway protection  CCM on board  On propofol  Obstructive hydrocephalus  Due to  IVH involving lateral, third and fourth ventricles  Status post EVD  Little EVD drainage  Dr. Conchita Paris following  Concerned ICP elevated given downward eye deviation  Hypertensive emergency  Unstable . Increase BP goal less than 160 . on Cleviprex . Labetalol PRN IV . Put on lisinopril 20 mg bid and labetalol 100 tid . Close monitoring  Cocaine abuse  UDS positive for cocaine  Cocaine cessation education will be provided  Other Stroke Risk Factors    Other Active Problems  Hyperglycemia  Hospital day # 2  Annie Main, MSN, APRN, ANVP-BC, AGPCNP-BC Advanced Practice Stroke Nurse [facility] See Amion for Schedule & Pager information 05/17/2017 6:05 PM  I have personally examined this patient, reviewed notes, independently viewed imaging studies, participated in medical decision making and plan of care.ROS completed by me personally and pertinent positives fully documented  I have made any additions or clarifications directly to the above note. Agree with note above.Patient`s neuro exam is poor despie ventricular drainage and hypertonic saline and prognosis is poor. Long d/w daughter and answered questions. May need to decide on goals of care and early trach/PEG over next few days. D/W Dr Wallace Cullens. This patient is critically ill and at significant risk of neurological worsening, death and care requires constant monitoring of vital signs, hemodynamics,respiratory and cardiac monitoring, extensive review of multiple databases, frequent neurological assessment, discussion with family, other specialists and medical decision making of high complexity.I have made any additions or clarifications directly to the above note.This critical care time does not reflect procedure time, or teaching time or supervisory time of PA/NP/Med Resident etc but could involve care discussion time.  I spent 30 minutes of neurocritical care time  in the care of  this patient.      Delia Heady, MD Medical Director [facility] Pager: [phone] 05/17/2017 8:41 PM   To contact Stroke Continuity provider, please refer to [email or web address]. After hours, contact General Neurology

## 2017-05-18 LAB — URINALYSIS, ROUTINE W REFLEX MICROSCOPIC
BILIRUBIN URINE: NEGATIVE
Glucose, UA: 50 mg/dL — AB
KETONES UR: NEGATIVE mg/dL
LEUKOCYTES UA: NEGATIVE
Nitrite: NEGATIVE
Protein, ur: 30 mg/dL — AB
SPECIFIC GRAVITY, URINE: 1.016 (ref 1.005–1.030)
pH: 5 (ref 5.0–8.0)

## 2017-05-18 LAB — CBC WITH DIFFERENTIAL/PLATELET
BASOS ABS: 0 10*3/uL (ref 0.0–0.1)
BASOS PCT: 0 %
Eosinophils Absolute: 0 10*3/uL (ref 0.0–0.7)
Eosinophils Relative: 0 %
HEMATOCRIT: 34.7 % — AB (ref 36.0–46.0)
HEMOGLOBIN: 10.9 g/dL — AB (ref 12.0–15.0)
Lymphocytes Relative: 13 %
Lymphs Abs: 1.8 10*3/uL (ref 0.7–4.0)
MCH: 28.9 pg (ref 26.0–34.0)
MCHC: 31.4 g/dL (ref 30.0–36.0)
MCV: 92 fL (ref 78.0–100.0)
MONOS PCT: 5 %
Monocytes Absolute: 0.7 10*3/uL (ref 0.1–1.0)
NEUTROS ABS: 11.6 10*3/uL — AB (ref 1.7–7.7)
NEUTROS PCT: 82 %
Platelets: 113 10*3/uL — ABNORMAL LOW (ref 150–400)
RBC: 3.77 MIL/uL — AB (ref 3.87–5.11)
RDW: 14 % (ref 11.5–15.5)
WBC: 14.1 10*3/uL — AB (ref 4.0–10.5)

## 2017-05-18 LAB — BASIC METABOLIC PANEL
Anion gap: 9 (ref 5–15)
BUN: 18 mg/dL (ref 6–20)
CO2: 18 mmol/L — ABNORMAL LOW (ref 22–32)
CREATININE: 1.32 mg/dL — AB (ref 0.44–1.00)
Calcium: 8.9 mg/dL (ref 8.9–10.3)
Chloride: 130 mmol/L — ABNORMAL HIGH (ref 101–111)
GFR calc Af Amer: 53 mL/min — ABNORMAL LOW (ref >60)
GFR, EST NON AFRICAN AMERICAN: 46 mL/min — AB (ref >60)
Glucose, Bld: 156 mg/dL — ABNORMAL HIGH (ref 65–99)
Potassium: 3.2 mmol/L — ABNORMAL LOW (ref 3.5–5.1)
SODIUM: 157 mmol/L — AB (ref 135–145)

## 2017-05-18 LAB — BLOOD GAS, ARTERIAL
Acid-base deficit: 2.7 mmol/L — ABNORMAL HIGH (ref 0.0–2.0)
Bicarbonate: 20.1 mmol/L (ref 20.0–28.0)
DRAWN BY: 36496
FIO2: 40
MECHVT: 420 mL
O2 Saturation: 95.3 %
PATIENT TEMPERATURE: 102
PCO2 ART: 29.2 mmHg — AB (ref 32.0–48.0)
PEEP: 5 cmH2O
RATE: 24 resp/min
pH, Arterial: 7.462 — ABNORMAL HIGH (ref 7.350–7.450)
pO2, Arterial: 74.7 mmHg — ABNORMAL LOW (ref 83.0–108.0)

## 2017-05-18 LAB — SODIUM
SODIUM: 156 mmol/L — AB (ref 135–145)
SODIUM: 155 mmol/L — AB (ref 135–145)
SODIUM: 152 mmol/L — AB (ref 135–145)
SODIUM: 155 mmol/L — AB (ref 135–145)

## 2017-05-18 LAB — GLUCOSE, CAPILLARY
GLUCOSE-CAPILLARY: 124 mg/dL — AB (ref 65–99)
GLUCOSE-CAPILLARY: 145 mg/dL — AB (ref 65–99)
Glucose-Capillary: 113 mg/dL — ABNORMAL HIGH (ref 65–99)
Glucose-Capillary: 150 mg/dL — ABNORMAL HIGH (ref 65–99)
Glucose-Capillary: 152 mg/dL — ABNORMAL HIGH (ref 65–99)
Glucose-Capillary: 156 mg/dL — ABNORMAL HIGH (ref 65–99)
Glucose-Capillary: 194 mg/dL — ABNORMAL HIGH (ref 65–99)

## 2017-05-18 LAB — MAGNESIUM
MAGNESIUM: 1.6 mg/dL — AB (ref 1.7–2.4)
Magnesium: 1.8 mg/dL (ref 1.7–2.4)

## 2017-05-18 LAB — POTASSIUM: Potassium: 3.8 mmol/L (ref 3.5–5.1)

## 2017-05-18 LAB — PHOSPHORUS: Phosphorus: 4.3 mg/dL (ref 2.5–4.6)

## 2017-05-18 LAB — PROCALCITONIN: PROCALCITONIN: 0.1 ng/mL

## 2017-05-18 MED ORDER — POTASSIUM & SODIUM PHOSPHATES 280-160-250 MG PO PACK
2.0000 | PACK | Freq: Three times a day (TID) | ORAL | Status: AC
  Administered 2017-05-18 (×3): 2 via ORAL
  Filled 2017-05-18 (×3): qty 2

## 2017-05-18 MED ORDER — HYDROCORTISONE NA SUCCINATE PF 100 MG IJ SOLR
50.0000 mg | Freq: Once | INTRAMUSCULAR | Status: AC
  Administered 2017-05-18: 50 mg via INTRAVENOUS
  Filled 2017-05-18: qty 2

## 2017-05-18 MED ORDER — LABETALOL HCL 100 MG PO TABS
200.0000 mg | ORAL_TABLET | Freq: Two times a day (BID) | ORAL | Status: DC
  Administered 2017-05-18 – 2017-05-19 (×4): 200 mg via ORAL
  Filled 2017-05-18 (×4): qty 2

## 2017-05-18 MED ORDER — DEXTROSE 5 % IV SOLN
30.0000 mmol | Freq: Once | INTRAVENOUS | Status: AC
  Administered 2017-05-18: 30 mmol via INTRAVENOUS
  Filled 2017-05-18: qty 10

## 2017-05-18 MED ORDER — ENOXAPARIN SODIUM 40 MG/0.4ML ~~LOC~~ SOLN
40.0000 mg | SUBCUTANEOUS | Status: DC
  Administered 2017-05-18 – 2017-05-21 (×4): 40 mg via SUBCUTANEOUS
  Filled 2017-05-18 (×4): qty 0.4

## 2017-05-18 MED ORDER — LABETALOL HCL 5 MG/ML IV SOLN
10.0000 mg | INTRAVENOUS | Status: DC | PRN
  Administered 2017-05-18 – 2017-05-21 (×8): 20 mg via INTRAVENOUS
  Filled 2017-05-18 (×7): qty 4

## 2017-05-18 MED ORDER — PIPERACILLIN-TAZOBACTAM 3.375 G IVPB 30 MIN
3.3750 g | Freq: Once | INTRAVENOUS | Status: AC
  Administered 2017-05-18: 3.375 g via INTRAVENOUS
  Filled 2017-05-18: qty 50

## 2017-05-18 MED ORDER — HYDRALAZINE HCL 20 MG/ML IJ SOLN
10.0000 mg | INTRAMUSCULAR | Status: DC | PRN
  Administered 2017-05-18 – 2017-05-21 (×2): 40 mg via INTRAVENOUS
  Filled 2017-05-18 (×2): qty 2

## 2017-05-18 MED ORDER — PIPERACILLIN-TAZOBACTAM 3.375 G IVPB
3.3750 g | Freq: Three times a day (TID) | INTRAVENOUS | Status: DC
  Administered 2017-05-18 – 2017-05-21 (×8): 3.375 g via INTRAVENOUS
  Filled 2017-05-18 (×9): qty 50

## 2017-05-18 MED ORDER — VANCOMYCIN HCL 500 MG IV SOLR
500.0000 mg | Freq: Two times a day (BID) | INTRAVENOUS | Status: DC
  Administered 2017-05-18 – 2017-05-20 (×5): 500 mg via INTRAVENOUS
  Filled 2017-05-18 (×6): qty 500

## 2017-05-18 MED ORDER — SODIUM CHLORIDE 0.9 % IV SOLN
1250.0000 mg | Freq: Once | INTRAVENOUS | Status: AC
  Administered 2017-05-18: 1250 mg via INTRAVENOUS
  Filled 2017-05-18: qty 1250

## 2017-05-18 NOTE — Progress Notes (Signed)
Pharmacy Antibiotic Note  Kendra Barry is a 52 y.o. female admitted on 2017-05-09 with stroke-like symptoms. Now starting broad spectrum abx for fever of unknown origin. Remains ventilated. SCr up slightly 1 > 1.3, eCrCl ~ 45 ml/min, mild leukocytosis.   Plan: -Zosyn 3.375 g IV q8h -Vancomycin 1250 mg IV x1 then 500 mg IV q12h -Monitor renal fx, cultures, VT as needed    Height: 5\' 3"  (160 cm) Weight: 139 lb 5.3 oz (63.2 kg) IBW/kg (Calculated) : 52.4  Temp (24hrs), Avg:101.3 F (38.5 C), Min:99.8 F (37.7 C), Max:103.1 F (39.5 C)  Recent Labs  Lab 10/06/17 1120  05/16/17 1638 05/16/17 2137 05/17/17 0250 05/17/17 0600 05/17/17 0937 05/18/17 0417  WBC 6.4  --   --   --   --  9.2  --  14.1*  CREATININE 1.08*   < > 0.91 0.92 1.01*  --  1.05* 1.32*   < > = values in this interval not displayed.      Antimicrobials this admission: 4/16 zosyn > 4/16 vancomycin >  Dose adjustments this admission: N/A  Microbiology results: 4/13 mrsa pcr: 4/16 blood cx: 4/16 resp cx:   Baldemar FridayMasters, Ramiel Forti M 05/18/2017 10:24 AM

## 2017-05-18 NOTE — Progress Notes (Addendum)
EVD has remained with minimal output. EVD removed at request of Dr Conchita ParisNundkumar.  EVD site closed with two staples in sterile fashion. There were no complications. Patient tolerated well. No further role for NS. Please call for any concerns.

## 2017-05-18 NOTE — Progress Notes (Signed)
eLink Physician-Brief Progress Note Patient Name: Kendra Barry DOB: 07/27/1965 MRN: [id number]   Date of Service  05/18/2017  HPI/Events of Note    eICU Interventions  Hypokalemia/ severe hypophos -repleted      Intervention Category Intermediate Interventions: Electrolyte abnormality - evaluation and management  Riyana Biel V. Dawt Reeb 05/18/2017, 5:44 AM

## 2017-05-18 NOTE — Progress Notes (Signed)
Pt started on normothermia 1330.  Error message on arctic sun delayed start time.  Pt's family updated.  Will continue to monitor.

## 2017-05-18 NOTE — Progress Notes (Signed)
Pt's family requested RN to stop temperature management, normothermia protocol.  MD made aware and plans to meet family before stopping protocol.  RN will continue to monitor.

## 2017-05-18 NOTE — Progress Notes (Signed)
OT Cancellation Note  Patient Details Name: Kendra Barry MRN: [id number] DOB: 06/12/1965   Cancelled Treatment:    Reason Eval/Treat Not Completed: Patient not medically ready;Active bedrest order. Pt remains intubated and sedated. Will return when pt medically ready and as schedule allows. Thank you.  Kathrina Crosley M Kavian Peters Pristine Gladhill MSOT, OTR/L Acute Rehab Pager: [phone] Office: [phone]  05/18/2017, 7:16 AM

## 2017-05-18 NOTE — Progress Notes (Signed)
PULMONARY / CRITICAL CARE MEDICINE   Name: Kendra Barry MRN: [id number] DOB: 05/28/1965    ADMISSION DATE:  05/03/2017   HISTORY OF PRESENT ILLNESS: This is a 52 year old who presented with left hemiparesis on 4/13.  She was found to have a right basal ganglia hemorrhage with extension into the ventricular system.  She developed mass effect from the bleed and was placed on hypertonic saline.  An EVD was placed. Tox screen was positive for cocaine No subjective change overnight. She is febrile this morning. She remains intubated and mechanically ventilated.  She is still on Cleviprex infusion for control of her blood pressure.  She is sedated with 40 mcg of propofol at the time of my exam      PAST MEDICAL HISTORY :  She  has no past medical history on file.  I spoke with her daughter who reports no known significant past medical history other than a suspicion of hypertension for which she takes no medications   PAST SURGICAL HISTORY: She  has no past surgical history on file.  Not on File  No current facility-administered medications on file prior to encounter.    No current outpatient medications on file prior to encounter.    FAMILY HISTORY:  Her has no family status information on file.    SOCIAL HISTORY: She    REVIEW OF SYSTEMS:   Unobtainable  SUBJECTIVE:  As above  VITAL SIGNS: BP 134/64   Pulse (!) 114   Temp (!) 103.1 F (39.5 C) Comment: PRN   Resp (!) 41   Ht 5\' 3"  (1.6 m)   Wt 139 lb 5.3 oz (63.2 kg)   LMP  (LMP Unknown)   SpO2 93%   BMI 24.68 kg/m   HEMODYNAMICS:    VENTILATOR SETTINGS: Vent Mode: PRVC FiO2 (%):  [40 %] 40 % Set Rate:  [24 bmp] 24 bmp Vt Set:  [420 mL] 420 mL PEEP:  [5 cmH20] 5 cmH20 Plateau Pressure:  [15 cmH20-18 cmH20] 18 cmH20  INTAKE / OUTPUT: I/O last 3 completed shifts: In: 4400.1 [I.V.:3166.7; NG/GT:883.3; IV Piggyback:350] Out: 1941 [Urine:1920; Drains:21]  PHYSICAL EXAMINATION: General: Intubated and in no  distress. Exam on propofol Neuro: There is no redness or purulence at the EVD site. no response to voice or sternal rub. Persistent bilateral downward gaze. Cardiovascular: PMI is hyperdynamic. S1 and S2 are regular with a 3/6 SEJM Lungs: Respirations are unlabored.  There is symmetric air movement and no wheezes.  Minute ventilation is 14 L/min  Abdomen: The abdomen is soft without any organomegaly masses or tenderness  Musculoskeletal: No dependent edema   LABS:  BMET Recent Labs  Lab 05/17/17 0250  05/17/17 0937  05/18/17 0253 05/18/17 0417 05/18/17 0741  NA 157*   < > 160*   < > 156* 157* 155*  K 3.4*  --  3.4*  --   --  3.2*  --   CL >130*  --  >130*  --   --  130*  --   CO2 19*  --  19*  --   --  18*  --   BUN 5*  --  9  --   --  18  --   CREATININE 1.01*  --  1.05*  --   --  1.32*  --   GLUCOSE 152*  --  147*  --   --  156*  --    < > = values in this interval not displayed.    Electrolytes  Recent Labs  Lab 05/17/17 0250 05/17/17 0937 05/17/17 1700 05/18/17 0417  CALCIUM 8.9 8.7*  --  8.9  MG  --  2.1 2.0 1.8  PHOS  --  2.9 2.1* <1.0*    CBC Recent Labs  Lab 05/11/2017 1120 05/06/2017 1129 05/17/17 0600 05/18/17 0417  WBC 6.4  --  9.2 14.1*  HGB 14.2 15.0 10.6* 10.9*  HCT 42.6 44.0 32.8* 34.7*  PLT 194  --  148* 113*    Coag's Recent Labs  Lab 05/07/2017 1120  APTT 25  INR 1.02    Sepsis Markers No results for input(s): LATICACIDVEN, PROCALCITON, O2SATVEN in the last 168 hours.  ABG Recent Labs  Lab 05/16/17 0040 05/16/17 1122 05/18/17 0340  PHART 7.388 7.358 7.462*  PCO2ART 34.8 39.9 29.2*  PO2ART 414* 160* 74.7*    Liver Enzymes Recent Labs  Lab 05/17/2017 1120  AST 22  ALT 13*  ALKPHOS 87  BILITOT 1.1  ALBUMIN 4.0    Cardiac Enzymes No results for input(s): TROPONINI, PROBNP in the last 168 hours.  Glucose Recent Labs  Lab 05/17/17 1239 05/17/17 1609 05/17/17 2029 05/18/17 0000 05/18/17 0333 05/18/17 0838  GLUCAP 149*  120* 158* 145* 152* 156*    Imaging No results found.     DISCUSSION:      This is a 52 year old who presented with hemiparesis and was found to have a basal ganglia hemorrhage associated with extreme hypertension.  Toxicology screen was positive for cocaine.  ASSESSMENT / PLAN:  PULMONARY A: She is intubated at present solely for airway control.   NEUROLOGIC A: Right basal ganglia hemorrhage with ventricular extension.  EVD is at +10.   Hypertonic saline has been administered to goal.    Fever: Cultures ordered and emperic abx initiated. CSF not sent.    Penny Pia, MD Critical Care Medicine [facility] Pager: [phone]  05/18/2017, 10:02 AM

## 2017-05-18 NOTE — Progress Notes (Signed)
PT Cancellation Note  Patient Details Name: Kendra Barry MRN: [id number] DOB: 03/23/1965   Cancelled Treatment:    Reason Eval/Treat Not Completed: Medical issues which prohibited therapy. Active bedrest order. Pt remains intubated and sedated. Will return when pt medically ready and as schedule allows. Thank you.  05/18/2017  [place] Kendra Barry, PT [phone]  (pager)     Eliseo GumKenneth V [name] 05/18/2017, 9:48 AM

## 2017-05-18 NOTE — Progress Notes (Addendum)
STROKE TEAM PROGRESS NOTE   SUBJECTIVE (INTERVAL HISTORY) Remains intubated and sedated. Temp up to > 103 during the night. Sister and daughter arrived during rounds. Dr. Pearlean BrownieSethi spoke with her. Sister and daughter awaiting other sister to arrive from [place] on Friday. They are discussing code status. They desire DNR with ongoing care. Consider withdrawal when sister arrives.    OBJECTIVE Temp:  [99.8 F (37.7 C)-103.1 F (39.5 C)] 103.1 F (39.5 C) (04/16 0800) Pulse Rate:  [80-122] 108 (04/16 0800) Resp:  [23-45] 37 (04/16 0800) BP: (108-173)/(49-112) 120/60 (04/16 0800) SpO2:  [94 %-100 %] 97 % (04/16 0800) Arterial Line BP: (78-216)/(44-148) 132/44 (04/16 0800) FiO2 (%):  [40 %] 40 % (04/16 0733) Weight:  [63.2 kg (139 lb 5.3 oz)-65.9 kg (145 lb 4.5 oz)] 63.2 kg (139 lb 5.3 oz) (04/16 0500)  CBC:  Recent Labs  Lab 05/28/2017 1120  05/17/17 0600 05/18/17 0417  WBC 6.4  --  9.2 14.1*  NEUTROABS 2.9  --   --  11.6*  HGB 14.2   < > 10.6* 10.9*  HCT 42.6   < > 32.8* 34.7*  MCV 88.0  --  92.4 92.0  PLT 194  --  148* 113*   < > = values in this interval not displayed.    Basic Metabolic Panel:  Recent Labs  Lab 05/17/17 0937  05/17/17 1700  05/18/17 0253 05/18/17 0417  NA 160*   < >  --    < > 156* 157*  K 3.4*  --   --   --   --  3.2*  CL >130*  --   --   --   --  130*  CO2 19*  --   --   --   --  18*  GLUCOSE 147*  --   --   --   --  156*  BUN 9  --   --   --   --  18  CREATININE 1.05*  --   --   --   --  1.32*  CALCIUM 8.7*  --   --   --   --  8.9  MG 2.1  --  2.0  --   --  1.8  PHOS 2.9  --  2.1*  --   --  <1.0*   < > = values in this interval not displayed.    Lipid Panel:     Component Value Date/Time   CHOL 149 05/17/2017 0430   TRIG 170 (H) 05/17/2017 0430   HDL 42 05/17/2017 0430   CHOLHDL 3.5 05/17/2017 0430   VLDL 34 05/17/2017 0430   LDLCALC 73 05/17/2017 0430   HgbA1c:  Lab Results  Component Value Date   HGBA1C 5.4 05/03/2017   Urine Drug  Screen:     Component Value Date/Time   LABOPIA NONE DETECTED 05/19/2017 1302   COCAINSCRNUR POSITIVE (A) 05/28/2017 1302   LABBENZ NONE DETECTED 05/25/2017 1302   AMPHETMU NONE DETECTED 05/26/2017 1302   THCU NONE DETECTED 05/19/2017 1302   LABBARB NONE DETECTED 05/09/2017 1302    Alcohol Level     Component Value Date/Time   ETH <10 06/01/2017 1235    IMAGING Ct Head Wo Contrast  Result Date: 05/17/2017 CLINICAL DATA:  Followup intracranial hemorrhage. EXAM: CT HEAD WITHOUT CONTRAST TECHNIQUE: Contiguous axial images were obtained from the base of the skull through the vertex without intravenous contrast. COMPARISON:  CT HEAD May 16, 2017 FINDINGS: BRAIN: RIGHT ventriculostomy catheter distal tip  traversing the RIGHT frontal horn of the lateral ventricle terminating near the circle of Willis. Stable mild hydrocephalus. Small amount of subarachnoid blood products and trace extra-axial pneumocephalus along catheter tract. Similar volume intraventricular blood products. Evolving large RIGHT thalamus intraparenchymal hematoma with regional mass effect and vasogenic edema. 7 mm RIGHT to LEFT midline shift, relatively unchanged. Old suspected RIGHT basal ganglia infarct. Old small LEFT cerebellar infarct. Confluent supratentorial white matter hypodensities are similar. No acute large vascular territory infarcts. Basal cisterns remain patent. VASCULAR: Unremarkable. SKULL/SOFT TISSUES: No skull fracture. Three RIGHT frontal burr holes. Mild RIGHT frontal scalp soft tissue swelling with subcutaneous gas and overlying skin staple. ORBITS/SINUSES: The included ocular globes and orbital contents are normal.The mastoid aircells and included paranasal sinuses are well-aerated. OTHER: None. IMPRESSION: 1. Stable position of RIGHT EVD with stable mild hydrocephalus and intraventricular hemorrhage. 2. Evolving RIGHT thalamic hematoma with regional mass effect and 7 mm RIGHT to LEFT midline shift.  Electronically Signed   By: Awilda Metro M.D.   On: 05/17/2017 05:12   Transthoracic Echocardiogram  - Left ventricle: The cavity size was normal. There was severe concentric hypertrophy. Systolic function was vigorous. The estimated ejection fraction was in the range of 65% to 70%. There was dynamic obstruction at restin the mid cavity, with mid-cavity obliteration and a peak gradient of 46 mm Hg. Wall motion was normal; there were no regional wall motion abnormalities. Features are consistent with a pseudonormal left ventricular filling pattern, with concomitant abnormal relaxation and increased filling pressure (grade 2 diastolic dysfunction). Doppler parameters are consistent with high ventricular filling pressure.   PHYSICAL EXAM General -cachectic, well developed, intubated, sedated. Cardiovascular - Regular rate and rhythm.  Neuro - intubated, on ventilation, eyes closed, not following commands.  With forced eye opening, eyes with downward gaze to the left, pupil bilateral pinpoint, nonreactive to light, sluggish doll's eyes, not blinking to visual threat bilaterally, not tracking objects, corneal reflex present, positive gag and cough.  On pain summation, right upper extremity proximal movement, left upper extremity posturing with intermittent mild withdraw to pain.  On pain summation right greater than left lower extremity withdraw to pain however not able to against gravity.  DTR 1+, Babinski bilaterally positive. Sensation, coordination and gait not tested. R EVD   ASSESSMENT/PLAN Ms. Saphyra Hutt is a 52 y.o. female with unknown medical history presenting with left-sided weakness, confusion, left facial droop, nausea and vomiting with urine incontinence. .   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 No source of embolus   LDL 73  HgbA1c 5.4  VTE prophylaxis -SCDs. Change to Lovenox 40 mg sq daily. Ok to use in ICH pt once stable Diet NPO time specified Fall precautions  No antithrombotic prior to admission, now on No antithrombotic  Ongoing aggressive stroke risk factor management  Therapy recommendations:  pending  Disposition:  Pending   DNR. Continue current treatment. Do not escalate. Family considering withdrawal on Friday when other sister arrives  Cerebral edema  Midline shift CT had  Concerned ICP elevated given downward eye deviation  Intermittent 23.4% saline  On 3% saline at 75 cc/h  Na 157. 3% on hold 4/15  Sodium goal 150-155  Sodium every 6h  Allows  sodium to drift down. Resume 3% if drifts less than 150  Respiratory failure  Intubated for airway protection  On propofol  CCM on board  Elevated temperature  Temp 102.4, 103.1  Central fever, secondary to stroke  Start artic sun.   Discussed with CCM who will place TTM order set  Goal normothermia  Obstructive hydrocephalus  Due to IVH involving lateral, third and fourth ventricles  Status post EVD  Little EVD drainage. Hydrocephalus not an issue  Ok with Dr. Conchita Paris plans to d/c drain today   Hypertensive emergency  BP as high as 177/112 . SBP BP goal < 160 . on Cleviprex  . Labetalol PRN IV . Put on lisinopril 20 mg bid and labetalol 100 tid . Close monitoring  Cocaine abuse  UDS positive for cocaine  Cocaine cessation education will be provided  Other Active Problems  Hyperglycemia  Hospital day # 3  Annie Main, MSN, APRN, ANVP-BC, AGPCNP-BC Advanced Practice Stroke Nurse [facility] See Amion for Schedule & Pager information 05/18/2017 9:04 AM   I have personally examined this patient, reviewed notes, independently viewed imaging studies, participated in  medical decision making and plan of care.ROS completed by me personally and pertinent positives fully documented  I have made any additions or clarifications directly to the above note. Agree with note above.The   Family is considering DNR and plan goals of care meeting on Friday after sister arrives from [place].Long d/w daughter and sister and answered questions. Plan induced normothermia for temp management for fever. This patient is critically ill and at significant risk of neurological worsening, death and care requires constant monitoring of vital signs, hemodynamics,respiratory and cardiac monitoring, extensive review of multiple databases, frequent neurological assessment, discussion with family, other specialists and medical decision making of high complexity.I have made any additions or clarifications directly to the above note.This critical care time does not reflect procedure time, or teaching time or supervisory time of PA/NP/Med Resident etc but could involve care discussion time.  I spent 35 minutes of neurocritical care time  in the care of  this patient.      Delia Heady, MD Medical Director [facility] Pager: [phone] 05/18/2017 5:15 PM   To contact Stroke Continuity provider, please refer to [email or web address]. After hours, contact General Neurology

## 2017-05-19 ENCOUNTER — Inpatient Hospital Stay (HOSPITAL_COMMUNITY): Payer: Medicaid Other

## 2017-05-19 DIAGNOSIS — I10 Essential (primary) hypertension: Secondary | ICD-10-CM

## 2017-05-19 DIAGNOSIS — J9601 Acute respiratory failure with hypoxia: Secondary | ICD-10-CM

## 2017-05-19 DIAGNOSIS — G9340 Encephalopathy, unspecified: Secondary | ICD-10-CM

## 2017-05-19 LAB — BASIC METABOLIC PANEL
ANION GAP: 9 (ref 5–15)
BUN: 19 mg/dL (ref 6–20)
CALCIUM: 8.5 mg/dL — AB (ref 8.9–10.3)
CHLORIDE: 121 mmol/L — AB (ref 101–111)
CO2: 23 mmol/L (ref 22–32)
CREATININE: 0.96 mg/dL (ref 0.44–1.00)
GFR calc non Af Amer: >60 mL/min (ref >60)
Glucose, Bld: 135 mg/dL — ABNORMAL HIGH (ref 65–99)
Potassium: 3.6 mmol/L (ref 3.5–5.1)
SODIUM: 153 mmol/L — AB (ref 135–145)

## 2017-05-19 LAB — GLUCOSE, CAPILLARY
GLUCOSE-CAPILLARY: 111 mg/dL — AB (ref 65–99)
GLUCOSE-CAPILLARY: 174 mg/dL — AB (ref 65–99)
Glucose-Capillary: 145 mg/dL — ABNORMAL HIGH (ref 65–99)
Glucose-Capillary: 180 mg/dL — ABNORMAL HIGH (ref 65–99)
Glucose-Capillary: 139 mg/dL — ABNORMAL HIGH (ref 65–99)

## 2017-05-19 LAB — CBC WITH DIFFERENTIAL/PLATELET
BASOS ABS: 0 10*3/uL (ref 0.0–0.1)
BASOS PCT: 0 %
EOS PCT: 0 %
Eosinophils Absolute: 0.1 10*3/uL (ref 0.0–0.7)
HCT: 36.2 % (ref 36.0–46.0)
Hemoglobin: 11.5 g/dL — ABNORMAL LOW (ref 12.0–15.0)
LYMPHS PCT: 14 %
Lymphs Abs: 2 10*3/uL (ref 0.7–4.0)
MCH: 29.1 pg (ref 26.0–34.0)
MCHC: 31.8 g/dL (ref 30.0–36.0)
MCV: 91.6 fL (ref 78.0–100.0)
MONO ABS: 0.8 10*3/uL (ref 0.1–1.0)
Monocytes Relative: 6 %
NEUTROS ABS: 11.8 10*3/uL — AB (ref 1.7–7.7)
Neutrophils Relative %: 80 %
PLATELETS: 114 10*3/uL — AB (ref 150–400)
RBC: 3.95 MIL/uL (ref 3.87–5.11)
RDW: 14.2 % (ref 11.5–15.5)
WBC: 14.7 10*3/uL — AB (ref 4.0–10.5)

## 2017-05-19 LAB — PHOSPHORUS: PHOSPHORUS: 4.3 mg/dL (ref 2.5–4.6)

## 2017-05-19 LAB — TRIGLYCERIDES: TRIGLYCERIDES: 85 mg/dL (ref <150)

## 2017-05-19 LAB — MAGNESIUM: Magnesium: 1.7 mg/dL (ref 1.7–2.4)

## 2017-05-19 LAB — PROCALCITONIN

## 2017-05-19 MED ORDER — MAGNESIUM SULFATE 2 GM/50ML IV SOLN
2.0000 g | Freq: Once | INTRAVENOUS | Status: AC
  Administered 2017-05-19: 2 g via INTRAVENOUS
  Filled 2017-05-19: qty 50

## 2017-05-19 MED ORDER — VITAL AF 1.2 CAL PO LIQD
1000.0000 mL | ORAL | Status: DC
  Administered 2017-05-19 – 2017-05-21 (×3): 1000 mL

## 2017-05-19 MED ORDER — POTASSIUM CHLORIDE 20 MEQ/15ML (10%) PO SOLN
40.0000 meq | Freq: Three times a day (TID) | ORAL | Status: AC
  Administered 2017-05-19 (×2): 40 meq
  Filled 2017-05-19 (×2): qty 30

## 2017-05-19 NOTE — Progress Notes (Signed)
Patient transported to CT scan and back to 4N27 without any complications.

## 2017-05-19 NOTE — Progress Notes (Signed)
PT Cancellation Note  Patient Details Name: Kendra Barry MRN: [id number] DOB: 09/27/1965   Cancelled Treatment:    Reason Eval/Treat Not Completed: Patient not medically ready.  Status remains tenuous.  Pt on active bedrest is sedated and intubate.  Will sign off at this time.  Please reorder PT if/when pt becomes appropriated for therapy.   05/19/2017  [place] BingKen Mishawn Didion, PT [phone]  (pager)   Kendra Barry 05/19/2017, 9:41 AM

## 2017-05-19 NOTE — Progress Notes (Signed)
Nutrition Follow-up  DOCUMENTATION CODES:   Not applicable  INTERVENTION:   - Vital AF 1.2 @ 50 ml/hr - Free water flushes per MD  Tube feeding regimen provides 1440 kcal, 90 grams of protein, and 972 ml of H2O.  NUTRITION DIAGNOSIS:   Inadequate oral intake related to inability to eat as evidenced by NPO status.  Ongoing  GOAL:   Patient will meet greater than or equal to 90% of their needs  Meeting with Tube Feeding  MONITOR:   Vent status, Labs, I & O's, TF tolerance, Weight trends  REASON FOR ASSESSMENT:   Ventilator, Consult Enteral/tube feeding initiation and management  ASSESSMENT:   52 year old female who arrived to the ED code stroke. PMH significant for HTN and cocaine abuse. 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   Pt discussed during ICU rounds and with RN.  Pt remains completely unresponsive.  Family discussion for goals of care scheduled for this Friday.   Pt placed on TTM yesterday, but family requested for it to be discontinued.  Propofol discontinued.  Will change tube feeding to better meet pt's needs.   Patient is currently intubated on ventilator support MV: 10.3 L/min Temp (24hrs), Avg:98.6 F (37 C), Min:98 F (36.7 C), Max:99.1 F (37.3 C)  Weight noted to trend up 2 lb since last RD visit 4/15 (145 lb to 147 lb).   I/O: +4243 ml since admit UOP: 2203 ml x 24 hrs  Medications reviewed and include: cleviprex, IV abx Labs reviewed: Na 153 (H)   Diet Order:  Diet NPO time specified Fall precautions  EDUCATION NEEDS:   No education needs have been identified at this time  Skin:  Skin Assessment: Reviewed RN Assessment  Last BM:  PTA  Height:   Ht Readings from Last 1 Encounters:  05/18/17 5\' 3"  (1.6 m)    Weight:   Wt Readings from Last 1 Encounters:  05/18/17 147 lb 0.8 oz (66.7 kg)    Ideal Body Weight:  52.3 kg  BMI:  Body mass index is 26.05 kg/m.  Estimated  Nutritional Needs:   Kcal:  1425 kcal  Protein:  80-100 g  Fluid:  >1.5 L/day   Vanessa Kickarly Kaybree Williams RD, LDN Clinical Nutrition Pager # [phone]

## 2017-05-19 NOTE — Progress Notes (Signed)
STROKE TEAM PROGRESS NOTE   SUBJECTIVE (INTERVAL HISTORY) Remains intubated and sedated. Temp  Now controlled. Head CT scan last night for change in neurological status but showed no significant change or increase hydrocephalus. Long discussion at the bedside with Sister and daughter awaiting other sister to arrive from [place] on Friday.  . They desire DNR with ongoing care. Consider withdrawal when sister arrives.    OBJECTIVE Temp:  [98 F (36.7 C)-99.1 F (37.3 C)] 98.5 F (36.9 C) (04/17 1600) Pulse Rate:  [77-117] 96 (04/17 1645) Cardiac Rhythm: Normal sinus rhythm (04/17 1600) Resp:  [18-46] 41 (04/17 1645) BP: (121-189)/(63-119) 162/83 (04/17 1645) SpO2:  [98 %-100 %] 98 % (04/17 1645) FiO2 (%):  [40 %] 40 % (04/17 1600)  CBC:  Recent Labs  Lab 05/18/17 0417 05/19/17 0217  WBC 14.1* 14.7*  NEUTROABS 11.6* 11.8*  HGB 10.9* 11.5*  HCT 34.7* 36.2  MCV 92.0 91.6  PLT 113* 114*    Basic Metabolic Panel:  Recent Labs  Lab 05/18/17 0417  05/18/17 1517 05/18/17 1941 05/19/17 0217  NA 157*   < > 152* 155* 153*  K 3.2*  --  3.8  --  3.6  CL 130*  --   --   --  121*  CO2 18*  --   --   --  23  GLUCOSE 156*  --   --   --  135*  BUN 18  --   --   --  19  CREATININE 1.32*  --   --   --  0.96  CALCIUM 8.9  --   --   --  8.5*  MG 1.8  --  1.6*  --  1.7  PHOS <1.0*  --  4.3  --  4.3   < > = values in this interval not displayed.    Lipid Panel:     Component Value Date/Time   CHOL 149 05/17/2017 0430   TRIG 85 05/19/2017 0217   HDL 42 05/17/2017 0430   CHOLHDL 3.5 05/17/2017 0430   VLDL 34 05/17/2017 0430   LDLCALC 73 05/17/2017 0430   HgbA1c:  Lab Results  Component Value Date   HGBA1C 5.4 2017/02/05   Urine Drug Screen:     Component Value Date/Time   LABOPIA NONE DETECTED 2017/02/05 1302   COCAINSCRNUR POSITIVE (A) 2017/02/05 1302   LABBENZ NONE DETECTED 2017/02/05 1302   AMPHETMU NONE DETECTED 2017/02/05 1302   THCU NONE DETECTED 2017/02/05 1302    LABBARB NONE DETECTED 2017/02/05 1302    Alcohol Level     Component Value Date/Time   ETH <10 2017/02/05 1235    IMAGING Ct Head Wo Contrast  Result Date: 05/19/2017 CLINICAL DATA:  Follow-up examination for acute stroke. EXAM: CT HEAD WITHOUT CONTRAST TECHNIQUE: Contiguous axial images were obtained from the base of the skull through the vertex without intravenous contrast. COMPARISON:  Prior CT from 05/17/2017. FINDINGS: Brain: Continued interval evolution of acute intraparenchymal hemorrhage centered at the right thalamus, measuring 3.7 x 3.1 x 3.4 cm on today's exam (estimated volume 19.5 cc, previously 17 cc surrounding low-density vasogenic edema with regional mass effect. Localized right-to-left midline shift measuring up to 8 mm. Intraventricular extension with blood seen layering within the lateral, third, and fourth ventricles, similar to previous. There has been interval removal of a right frontal approach ventriculostomy catheter. Obstructive hydrocephalus is slightly worsened from previous. Increased in involving intraparenchymal hemorrhage along the right frontal ventriculostomy tract with localized edema. No other acute  intracranial hemorrhage. Small remote left cerebellar infarct noted. Suspected chronic right basal ganglia lacunar infarct noted as well. No acute large vessel territory infarct. No extra-axial fluid collection. Vascular: No hyperdense vessel. Skull: Prior right frontal burr holes noted. Scalp soft tissues and calvarium otherwise within normal limits. Sinuses/Orbits: Globes and orbital soft tissues within normal limits. Paranasal sinuses and mastoid air cells are largely clear. Layering fluid noted within the posterior nasopharynx. Patient is suspected to be intubated. Other: None. IMPRESSION: 1. Continued interval evolution of right thalamic intraparenchymal hemorrhage, slightly increased in size as compared to previous, estimated volume 19.5 cc on today's exam.  Associated regional mass effect with 8 mm of localized right-to-left midline shift. 2. Similar intraventricular hemorrhage. Interval removal of right frontal EVD with slightly worsened obstructive hydrocephalus. 3. Small volume intraparenchymal hemorrhage and edema along the previous right frontal ventriculostomy tract. Electronically Signed   By: Rise Mu M.D.   On: 05/19/2017 00:51   Dg Chest Port 1 View  Result Date: 05/19/2017 CLINICAL DATA:  52 year old with fever. EXAM: PORTABLE CHEST 1 VIEW COMPARISON:  Chest radiograph 05/16/2017 FINDINGS: Endotracheal tube is 3.7 cm above the carina. Nasogastric tube extends into the abdomen. PICC line tip is near the superior cavoatrial junction. Increased densities at the left lung base particularly in the retrocardiac space. Fullness in the right hilum. Heart size is within normal limits and stable. Blunting at the left costophrenic angle. IMPRESSION: Increased densities at the left lung base. Findings could be associated with atelectasis but also concerning for a developing pneumonia or aspiration. Cannot exclude a small left pleural effusion. Electronically Signed   By: Richarda Overlie M.D.   On: 05/19/2017 08:45   Transthoracic Echocardiogram  - Left ventricle: The cavity size was normal. There was severe concentric hypertrophy. Systolic function was vigorous. The estimated ejection fraction was in the range of 65% to 70%. There was dynamic obstruction at restin the mid cavity, with mid-cavity obliteration and a peak gradient of 46 mm Hg. Wall motion was normal; there were no regional wall motion abnormalities. Features are consistent with a pseudonormal left ventricular filling pattern, with concomitant abnormal relaxation and increased filling pressure (grade 2 diastolic dysfunction). Doppler parameters are consistent with high ventricular filling pressure.   PHYSICAL EXAM General -cachectic, well developed, intubated, sedated. Cardiovascular -  Regular rate and rhythm.  Neuro - intubated, on ventilation, eyes closed, not following commands.  With forced eye opening, eyes with downward gaze to the left, pupil bilateral pinpoint, nonreactive to light, sluggish doll's eyes, not blinking to visual threat bilaterally, not tracking objects, corneal reflex present, positive gag and cough.  On pain summation, right upper extremity proximal movement, left upper extremity posturing with intermittent mild withdraw to pain.  On pain summation right greater than left lower extremity withdraw to pain however not able to against gravity.  DTR 1+, Babinski bilaterally positive. Sensation, coordination and gait not tested. R EVD   ASSESSMENT/PLAN Ms. Kendra Barry is a 52 y.o. female with unknown medical history presenting with left-sided weakness, confusion, left facial droop, nausea and vomiting with urine incontinence. .   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 No source of embolus   LDL 73  HgbA1c 5.4  VTE prophylaxis -  SCDs. Change to Lovenox 40 mg sq daily. Ok to use in ICH pt once stable Diet NPO time specified Fall precautions  No antithrombotic prior to admission, now on No antithrombotic  Ongoing aggressive stroke risk factor management  Therapy recommendations:  pending  Disposition:  Pending   DNR. Continue current treatment. Do not escalate. Family considering withdrawal on Friday when other sister arrives  Cerebral edema  Midline shift CT had  Concerned ICP elevated given downward eye deviation  Intermittent 23.4% saline  On 3% saline at 75 cc/h  Na 157. 3% on hold 4/15  Sodium goal 150-155  Sodium every 6h  Allows sodium to drift down. Resume 3% if drifts less  than 150  Respiratory failure  Intubated for airway protection  On propofol  CCM on board  Elevated temperature  Temp 102.4, 103.1  Central fever, secondary to stroke  Start artic sun.   Discussed with CCM who will place TTM order set  Goal normothermia  Obstructive hydrocephalus  Due to IVH involving lateral, third and fourth ventricles  Status post EVD  Little EVD drainage. Hydrocephalus not an issue  Ok with Dr. Conchita Paris plans to d/c drain today   Hypertensive emergency  BP as high as 177/112 . SBP BP goal < 160 . on Cleviprex  . Labetalol PRN IV . Put on lisinopril 20 mg bid and labetalol 100 tid . Close monitoring  Cocaine abuse  UDS positive for cocaine  Cocaine cessation education will be provided  Other Active Problems  Hyperglycemia  Hospital day # 4     I have personally examined this patient, reviewed notes, independently viewed imaging studies, participated in medical decision making and plan of care.ROS completed by me personally and pertinent positives fully documented  I have made any additions or clarifications directly to the above note. Agree with note above.The   Family is considering DNR and plan goals of care meeting on Friday after sister arrives from [place].Long d/w daughter and sister and answered questions. Plan induced normothermia for temp management for fever. This patient is critically ill and at significant risk of neurological worsening, death and care requires constant monitoring of vital signs, hemodynamics,respiratory and cardiac monitoring, extensive review of multiple databases, frequent neurological assessment, discussion with family, other specialists and medical decision making of high complexity.I have made any additions or clarifications directly to the above note.This critical care time does not reflect procedure time, or teaching time or supervisory time of PA/NP/Med Resident etc but could involve care discussion  time.  I spent 35 minutes of neurocritical care time  in the care of  this patient.      Delia Heady, MD Medical Director [facility] Pager: [phone] 05/19/2017 5:16 PM   To contact Stroke Continuity provider, please refer to [email or web address]. After hours, contact General Neurology

## 2017-05-19 NOTE — Progress Notes (Signed)
SLP Cancellation Note  Patient Details Name: Aniceto Bossngela Churchwell MRN: [id number] DOB: 05/08/1965   Cancelled treatment:       Reason Eval/Treat Not Completed: Patient not medically ready, will sign off.    Lourie Retz, Riley NearingBonnie Caroline 05/19/2017, 7:45 AM

## 2017-05-19 NOTE — Progress Notes (Signed)
OT Cancellation Note  Patient Details Name: Kendra Barry MRN: [id number] DOB: 11/19/1965   Cancelled Treatment:    Reason Eval/Treat Not Completed: Patient not medically ready;Active bedrest order. Pt sedated and intubated. Will sign off till medically ready. Please re-order OT consult when pt is medically ready. Thank you.  Salvadore Valvano M Kadarius Cuffe Samarion Ehle MSOT, OTR/L Acute Rehab Pager: [phone] Office: [phone] 05/19/2017, 7:23 AM

## 2017-05-19 NOTE — Progress Notes (Signed)
Repeat CT due to increase in NIH score. Slight increase in ventricular size, but on my exam, very similar to one documented today, withdraws on right, postures on left. Will likley need serial imaging for hydrocephalus, but with similar exam, would be hesitant to replace EVD at this time. Will follow.   Kendra Barry Kendra Panepinto, MD [facility] [phone]  If 7pm- 7am, please page neurology on call as listed in AMION.

## 2017-05-19 NOTE — Progress Notes (Signed)
PULMONARY / CRITICAL CARE MEDICINE   Name: Kendra Barry MRN: [id number] DOB: 11/13/1965    ADMISSION DATE:  05/08/2017   HISTORY OF PRESENT ILLNESS: This is a 52 year old who presented with left hemiparesis on 4/13.  She was found to have a right basal ganglia hemorrhage with extension into the ventricular system.  She developed mass effect from the bleed and was placed on hypertonic saline.  An EVD was placed. Tox screen was positive for cocaine She is still on Cleviprex infusion for control of her blood pressure.     SUBJECTIVE:  No events overnight, remains completely unresponsive  VITAL SIGNS: BP (!) 166/72   Pulse (!) 107   Temp 98.7 F (37.1 C) (Axillary)   Resp (!) 28   Ht 5\' 3"  (1.6 m)   Wt 147 lb 0.8 oz (66.7 kg)   LMP  (LMP Unknown)   SpO2 100%   BMI 26.05 kg/m   HEMODYNAMICS:    VENTILATOR SETTINGS: Vent Mode: PSV;CPAP FiO2 (%):  [40 %] 40 % Set Rate:  [20 bmp-24 bmp] 24 bmp Vt Set:  [420 mL] 420 mL PEEP:  [5 cmH20] 5 cmH20 Pressure Support:  [15 cmH20] 15 cmH20 Plateau Pressure:  [16 cmH20-19 cmH20] 16 cmH20  INTAKE / OUTPUT: I/O last 3 completed shifts: In: 3834.5 [I.V.:1174.5; NG/GT:1850; IV Piggyback:810] Out: 2685 [Urine:2625; Drains:60]  PHYSICAL EXAMINATION: General: Intubated, not following commands Neuro: EVD in place, not following commands, opens eyes Cardiovascular: RRR, Nl S1/S2 and -M/R/G Lungs: Coarse BS diffusely Abdomen: Soft, NT, ND and +BS Musculoskeletal: -edema and -tenderness Skin: intact  LABS:  BMET Recent Labs  Lab 05/17/17 0937  05/18/17 0417  05/18/17 1517 05/18/17 1941 05/19/17 0217  NA 160*   < > 157*   < > 152* 155* 153*  K 3.4*  --  3.2*  --  3.8  --  3.6  CL >130*  --  130*  --   --   --  121*  CO2 19*  --  18*  --   --   --  23  BUN 9  --  18  --   --   --  19  CREATININE 1.05*  --  1.32*  --   --   --  0.96  GLUCOSE 147*  --  156*  --   --   --  135*   < > = values in this interval not displayed.     Electrolytes Recent Labs  Lab 05/17/17 0937  05/18/17 0417 05/18/17 1517 05/19/17 0217  CALCIUM 8.7*  --  8.9  --  8.5*  MG 2.1   < > 1.8 1.6* 1.7  PHOS 2.9   < > <1.0* 4.3 4.3   < > = values in this interval not displayed.    CBC Recent Labs  Lab 05/17/17 0600 05/18/17 0417 05/19/17 0217  WBC 9.2 14.1* 14.7*  HGB 10.6* 10.9* 11.5*  HCT 32.8* 34.7* 36.2  PLT 148* 113* 114*    Coag's Recent Labs  Lab 05/27/2017 1120  APTT 25  INR 1.02    Sepsis Markers Recent Labs  Lab 05/18/17 1147 05/19/17 0217  PROCALCITON 0.10 <0.10    ABG Recent Labs  Lab 05/16/17 0040 05/16/17 1122 05/18/17 0340  PHART 7.388 7.358 7.462*  PCO2ART 34.8 39.9 29.2*  PO2ART 414* 160* 74.7*    Liver Enzymes Recent Labs  Lab 05/14/2017 1120  AST 22  ALT 13*  ALKPHOS 87  BILITOT 1.1  ALBUMIN 4.0  Cardiac Enzymes No results for input(s): TROPONINI, PROBNP in the last 168 hours.  Glucose Recent Labs  Lab 05/18/17 1129 05/18/17 1551 05/18/17 2023 05/18/17 2345 05/19/17 0345 05/19/17 0805  GLUCAP 150* 194* 124* 113* 111* 174*    Imaging Ct Head Wo Contrast  Result Date: 05/19/2017 CLINICAL DATA:  Follow-up examination for acute stroke. EXAM: CT HEAD WITHOUT CONTRAST TECHNIQUE: Contiguous axial images were obtained from the base of the skull through the vertex without intravenous contrast. COMPARISON:  Prior CT from 05/17/2017. FINDINGS: Brain: Continued interval evolution of acute intraparenchymal hemorrhage centered at the right thalamus, measuring 3.7 x 3.1 x 3.4 cm on today's exam (estimated volume 19.5 cc, previously 17 cc surrounding low-density vasogenic edema with regional mass effect. Localized right-to-left midline shift measuring up to 8 mm. Intraventricular extension with blood seen layering within the lateral, third, and fourth ventricles, similar to previous. There has been interval removal of a right frontal approach ventriculostomy catheter. Obstructive  hydrocephalus is slightly worsened from previous. Increased in involving intraparenchymal hemorrhage along the right frontal ventriculostomy tract with localized edema. No other acute intracranial hemorrhage. Small remote left cerebellar infarct noted. Suspected chronic right basal ganglia lacunar infarct noted as well. No acute large vessel territory infarct. No extra-axial fluid collection. Vascular: No hyperdense vessel. Skull: Prior right frontal burr holes noted. Scalp soft tissues and calvarium otherwise within normal limits. Sinuses/Orbits: Globes and orbital soft tissues within normal limits. Paranasal sinuses and mastoid air cells are largely clear. Layering fluid noted within the posterior nasopharynx. Patient is suspected to be intubated. Other: None. IMPRESSION: 1. Continued interval evolution of right thalamic intraparenchymal hemorrhage, slightly increased in size as compared to previous, estimated volume 19.5 cc on today's exam. Associated regional mass effect with 8 mm of localized right-to-left midline shift. 2. Similar intraventricular hemorrhage. Interval removal of right frontal EVD with slightly worsened obstructive hydrocephalus. 3. Small volume intraparenchymal hemorrhage and edema along the previous right frontal ventriculostomy tract. Electronically Signed   By: Rise Mu M.D.   On: 05/19/2017 00:51   Dg Chest Port 1 View  Result Date: 05/19/2017 CLINICAL DATA:  52 year old with fever. EXAM: PORTABLE CHEST 1 VIEW COMPARISON:  Chest radiograph 05/16/2017 FINDINGS: Endotracheal tube is 3.7 cm above the carina. Nasogastric tube extends into the abdomen. PICC line tip is near the superior cavoatrial junction. Increased densities at the left lung base particularly in the retrocardiac space. Fullness in the right hilum. Heart size is within normal limits and stable. Blunting at the left costophrenic angle. IMPRESSION: Increased densities at the left lung base. Findings could be  associated with atelectasis but also concerning for a developing pneumonia or aspiration. Cannot exclude a small left pleural effusion. Electronically Signed   By: Richarda Overlie M.D.   On: 05/19/2017 08:45       DISCUSSION:      This is a 52 year old who presented with hemiparesis and was found to have a basal ganglia hemorrhage associated with extreme hypertension.  Toxicology screen was positive for cocaine.  ASSESSMENT / PLAN:  PULMONARY A: VDRF for airway protection - Begin PS but no extubation given mental status - Titrate O2 for sat of 88-92% - Will likely need tracheostomy next week, will defer discussion with family for now.  NEUROLOGIC A: Right basal ganglia hemorrhage with ventricular extension.  EVD is at +10.   Hypertonic saline has been administered to goal.    Fever: Cultures ordered and emperic abx initiated. CSF not sent.   Renal: hypokalemia and  hypomag - Replace electrolytes as indicated - BMET in AM - Na management per neuro  The patient is critically ill with multiple organ systems failure and requires high complexity decision making for assessment and support, frequent evaluation and titration of therapies, application of advanced monitoring technologies and extensive interpretation of multiple databases.   Critical Care Time devoted to patient care services described in this note is  35  Minutes. This time reflects time of care of this signee Dr Koren Bound. This critical care time does not reflect procedure time, or teaching time or supervisory time of PA/NP/Med student/Med Resident etc but could involve care discussion time.  Alyson Reedy, M.D. [facility] Pulmonary/Critical Care Medicine. Pager: [phone]. After hours pager: [phone].  05/19/2017, 9:44 AM

## 2017-05-20 ENCOUNTER — Inpatient Hospital Stay (HOSPITAL_COMMUNITY): Payer: Medicaid Other

## 2017-05-20 DIAGNOSIS — J9600 Acute respiratory failure, unspecified whether with hypoxia or hypercapnia: Secondary | ICD-10-CM

## 2017-05-20 LAB — CBC WITH DIFFERENTIAL/PLATELET
Basophils Absolute: 0 10*3/uL (ref 0.0–0.1)
Basophils Relative: 0 %
EOS ABS: 0.2 10*3/uL (ref 0.0–0.7)
EOS PCT: 2 %
HCT: 39.1 % (ref 36.0–46.0)
HEMOGLOBIN: 12.5 g/dL (ref 12.0–15.0)
LYMPHS ABS: 1.6 10*3/uL (ref 0.7–4.0)
Lymphocytes Relative: 13 %
MCH: 29.2 pg (ref 26.0–34.0)
MCHC: 32 g/dL (ref 30.0–36.0)
MCV: 91.4 fL (ref 78.0–100.0)
Monocytes Absolute: 0.7 10*3/uL (ref 0.1–1.0)
Monocytes Relative: 6 %
Neutro Abs: 9.8 10*3/uL — ABNORMAL HIGH (ref 1.7–7.7)
Neutrophils Relative %: 79 %
PLATELETS: 134 10*3/uL — AB (ref 150–400)
RBC: 4.28 MIL/uL (ref 3.87–5.11)
RDW: 14.2 % (ref 11.5–15.5)
WBC: 12.4 10*3/uL — ABNORMAL HIGH (ref 4.0–10.5)

## 2017-05-20 LAB — HEPATIC FUNCTION PANEL
ALT: 20 U/L (ref 14–54)
AST: 21 U/L (ref 15–41)
Albumin: 2.6 g/dL — ABNORMAL LOW (ref 3.5–5.0)
Alkaline Phosphatase: 63 U/L (ref 38–126)
BILIRUBIN DIRECT: 0.2 mg/dL (ref 0.1–0.5)
BILIRUBIN TOTAL: 0.8 mg/dL (ref 0.3–1.2)
Indirect Bilirubin: 0.6 mg/dL (ref 0.3–0.9)
Total Protein: 7.1 g/dL (ref 6.5–8.1)

## 2017-05-20 LAB — BASIC METABOLIC PANEL
ANION GAP: 9 (ref 5–15)
BUN: 18 mg/dL (ref 6–20)
CALCIUM: 8.8 mg/dL — AB (ref 8.9–10.3)
CO2: 24 mmol/L (ref 22–32)
Chloride: 119 mmol/L — ABNORMAL HIGH (ref 101–111)
Creatinine, Ser: 0.74 mg/dL (ref 0.44–1.00)
Glucose, Bld: 168 mg/dL — ABNORMAL HIGH (ref 65–99)
Potassium: 3.8 mmol/L (ref 3.5–5.1)
SODIUM: 152 mmol/L — AB (ref 135–145)

## 2017-05-20 LAB — GLUCOSE, CAPILLARY
GLUCOSE-CAPILLARY: 128 mg/dL — AB (ref 65–99)
GLUCOSE-CAPILLARY: 131 mg/dL — AB (ref 65–99)
GLUCOSE-CAPILLARY: 138 mg/dL — AB (ref 65–99)
GLUCOSE-CAPILLARY: 168 mg/dL — AB (ref 65–99)
Glucose-Capillary: 131 mg/dL — ABNORMAL HIGH (ref 65–99)
Glucose-Capillary: 126 mg/dL — ABNORMAL HIGH (ref 65–99)
Glucose-Capillary: 121 mg/dL — ABNORMAL HIGH (ref 65–99)

## 2017-05-20 LAB — APTT: aPTT: 28 seconds (ref 24–36)

## 2017-05-20 LAB — PHOSPHORUS: PHOSPHORUS: 2.9 mg/dL (ref 2.5–4.6)

## 2017-05-20 LAB — PROTIME-INR
INR: 1
Prothrombin Time: 13.1 seconds (ref 11.4–15.2)

## 2017-05-20 LAB — MAGNESIUM: MAGNESIUM: 2.2 mg/dL (ref 1.7–2.4)

## 2017-05-20 LAB — CK: CK TOTAL: 95 U/L (ref 38–234)

## 2017-05-20 LAB — AMMONIA: Ammonia: 30 umol/L (ref 9–35)

## 2017-05-20 LAB — PROCALCITONIN: Procalcitonin: <0.1 ng/mL

## 2017-05-20 MED ORDER — LORAZEPAM 2 MG/ML IJ SOLN
2.0000 mg | Freq: Once | INTRAMUSCULAR | Status: AC
  Administered 2017-05-20: 2 mg via INTRAVENOUS
  Filled 2017-05-20: qty 1

## 2017-05-20 MED ORDER — LABETALOL HCL 100 MG PO TABS
400.0000 mg | ORAL_TABLET | Freq: Two times a day (BID) | ORAL | Status: DC
  Administered 2017-05-20 – 2017-05-22 (×4): 400 mg via ORAL
  Filled 2017-05-20 (×4): qty 4

## 2017-05-20 MED ORDER — LABETALOL HCL 100 MG PO TABS
200.0000 mg | ORAL_TABLET | Freq: Three times a day (TID) | ORAL | Status: DC
  Administered 2017-05-20: 200 mg via ORAL
  Filled 2017-05-20: qty 2

## 2017-05-20 NOTE — Progress Notes (Signed)
STROKE TEAM PROGRESS NOTE   SUBJECTIVE (INTERVAL HISTORY) Remains intubated and sedated. Temp No family at the bedside. now well controlled. Vitals remained stable except she has some tachycardia today   OBJECTIVE Temp:  [98.3 F (36.8 C)-98.7 F (37.1 C)] 98.7 F (37.1 C) (04/18 1200) Pulse Rate:  [82-128] 96 (04/18 1245) Cardiac Rhythm: Normal sinus rhythm (04/18 1200) Resp:  [18-41] 25 (04/18 1245) BP: (113-212)/(71-100) 147/80 (04/18 1245) SpO2:  [96 %-100 %] 100 % (04/18 1245) FiO2 (%):  [40 %] 40 % (04/18 1200) Weight:  [147 lb 0.8 oz (66.7 kg)] 147 lb 0.8 oz (66.7 kg) (04/18 0515)  CBC:  Recent Labs  Lab 05/19/17 0217 05/20/17 0500  WBC 14.7* 12.4*  NEUTROABS 11.8* 9.8*  HGB 11.5* 12.5  HCT 36.2 39.1  MCV 91.6 91.4  PLT 114* 134*    Basic Metabolic Panel:  Recent Labs  Lab 05/19/17 0217 05/20/17 0500  NA 153* 152*  K 3.6 3.8  CL 121* 119*  CO2 23 24  GLUCOSE 135* 168*  BUN 19 18  CREATININE 0.96 0.74  CALCIUM 8.5* 8.8*  MG 1.7 2.2  PHOS 4.3 2.9    Lipid Panel:     Component Value Date/Time   CHOL 149 05/17/2017 0430   TRIG 85 05/19/2017 0217   HDL 42 05/17/2017 0430   CHOLHDL 3.5 05/17/2017 0430   VLDL 34 05/17/2017 0430   LDLCALC 73 05/17/2017 0430   HgbA1c:  Lab Results  Component Value Date   HGBA1C 5.4 2017/05/17   Urine Drug Screen:     Component Value Date/Time   LABOPIA NONE DETECTED May 17, 2017 1302   COCAINSCRNUR POSITIVE (A) 2017/05/17 1302   LABBENZ NONE DETECTED 05-17-17 1302   AMPHETMU NONE DETECTED 05-17-17 1302   THCU NONE DETECTED 05/17/17 1302   LABBARB NONE DETECTED 05-17-2017 1302    Alcohol Level     Component Value Date/Time   ETH <10 May 17, 2017 1235    IMAGING Ct Head Wo Contrast  Result Date: 05/19/2017 CLINICAL DATA:  Follow-up examination for acute stroke. EXAM: CT HEAD WITHOUT CONTRAST TECHNIQUE: Contiguous axial images were obtained from the base of the skull through the vertex without  intravenous contrast. COMPARISON:  Prior CT from 05/17/2017. FINDINGS: Brain: Continued interval evolution of acute intraparenchymal hemorrhage centered at the right thalamus, measuring 3.7 x 3.1 x 3.4 cm on today's exam (estimated volume 19.5 cc, previously 17 cc surrounding low-density vasogenic edema with regional mass effect. Localized right-to-left midline shift measuring up to 8 mm. Intraventricular extension with blood seen layering within the lateral, third, and fourth ventricles, similar to previous. There has been interval removal of a right frontal approach ventriculostomy catheter. Obstructive hydrocephalus is slightly worsened from previous. Increased in involving intraparenchymal hemorrhage along the right frontal ventriculostomy tract with localized edema. No other acute intracranial hemorrhage. Small remote left cerebellar infarct noted. Suspected chronic right basal ganglia lacunar infarct noted as well. No acute large vessel territory infarct. No extra-axial fluid collection. Vascular: No hyperdense vessel. Skull: Prior right frontal burr holes noted. Scalp soft tissues and calvarium otherwise within normal limits. Sinuses/Orbits: Globes and orbital soft tissues within normal limits. Paranasal sinuses and mastoid air cells are largely clear. Layering fluid noted within the posterior nasopharynx. Patient is suspected to be intubated. Other: None. IMPRESSION: 1. Continued interval evolution of right thalamic intraparenchymal hemorrhage, slightly increased in size as compared to previous, estimated volume 19.5 cc on today's exam. Associated regional mass effect with 8 mm of localized right-to-left midline  shift. 2. Similar intraventricular hemorrhage. Interval removal of right frontal EVD with slightly worsened obstructive hydrocephalus. 3. Small volume intraparenchymal hemorrhage and edema along the previous right frontal ventriculostomy tract. Electronically Signed   By: Rise MuBenjamin  McClintock M.D.    On: 05/19/2017 00:51   Dg Chest Port 1 View  Result Date: 05/20/2017 CLINICAL DATA:  Intubated EXAM: PORTABLE CHEST 1 VIEW COMPARISON:  05/19/2017 FINDINGS: Support devices are stable. Airspace disease in the left lower lobe is stable. Right lung clear. Heart is normal size. No visible effusions. IMPRESSION: Continued left lower lobe atelectasis or pneumonia, similar to prior study. Electronically Signed   By: Charlett NoseKevin  Dover M.D.   On: 05/20/2017 09:12   Dg Chest Port 1 View  Result Date: 05/19/2017 CLINICAL DATA:  52 year old with fever. EXAM: PORTABLE CHEST 1 VIEW COMPARISON:  Chest radiograph 05/16/2017 FINDINGS: Endotracheal tube is 3.7 cm above the carina. Nasogastric tube extends into the abdomen. PICC line tip is near the superior cavoatrial junction. Increased densities at the left lung base particularly in the retrocardiac space. Fullness in the right hilum. Heart size is within normal limits and stable. Blunting at the left costophrenic angle. IMPRESSION: Increased densities at the left lung base. Findings could be associated with atelectasis but also concerning for a developing pneumonia or aspiration. Cannot exclude a small left pleural effusion. Electronically Signed   By: Richarda OverlieAdam  Henn M.D.   On: 05/19/2017 08:45   Transthoracic Echocardiogram  - Left ventricle: The cavity size was normal. There was severe concentric hypertrophy. Systolic function was vigorous. The estimated ejection fraction was in the range of 65% to 70%. There was dynamic obstruction at restin the mid cavity, with mid-cavity obliteration and a peak gradient of 46 mm Hg. Wall motion was normal; there were no regional wall motion abnormalities. Features are consistent with a pseudonormal left ventricular filling pattern, with concomitant abnormal relaxation and increased filling pressure (grade 2 diastolic dysfunction). Doppler parameters are consistent with high ventricular filling pressure.   PHYSICAL EXAM General  -cachectic, well developed, intubated, sedated. Cardiovascular - Regular rate and rhythm.  Neuro - intubated, on ventilation, eyes closed, not following commands.  With forced eye opening, eyes with downward gaze to the left, pupil bilateral pinpoint, nonreactive to light, sluggish doll's eyes, not blinking to visual threat bilaterally, not tracking objects, corneal reflex present, positive gag and cough.  On pain summation, right upper extremity proximal movement, left upper extremity posturing with intermittent mild withdraw to pain.  On pain summation right greater than left lower extremity withdraw to pain however not able to against gravity.  DTR 1+, Babinski bilaterally positive. Sensation, coordination and gait not tested. R EVD   ASSESSMENT/PLAN Kendra Barry is a 52 y.o. female with unknown medical history presenting with left-sided weakness, confusion, left facial droop, nausea and vomiting with urine incontinence. .   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 No source of embolus   LDL 73  HgbA1c 5.4  VTE prophylaxis -SCDs. Change to Lovenox 40 mg sq daily. Ok to use in ICH pt once stable Diet NPO time specified Fall precautions  No antithrombotic prior to admission, now on No antithrombotic  Ongoing aggressive stroke risk factor management  Therapy recommendations:  pending  Disposition:  Pending   DNR. Continue current treatment. Do not escalate. Family considering withdrawal on Friday when other sister arrives  Cerebral edema  Midline shift CT had  Concerned ICP elevated given downward eye deviation  Intermittent 23.4% saline  On 3% saline at 75 cc/h  Na 157. 3% on hold 4/15  Sodium goal 150-155  Sodium  every 6h  Allows sodium to drift down. Resume 3% if drifts less than 150  Respiratory failure  Intubated for airway protection  On propofol  CCM on board  Elevated temperature  Temp 102.4, 103.1  Central fever, secondary to stroke  Start artic sun.   Discussed with CCM who will place TTM order set  Goal normothermia  Obstructive hydrocephalus  Due to IVH involving lateral, third and fourth ventricles  Status post EVD  Little EVD drainage. Hydrocephalus not an issue  Ok with Dr. Conchita Paris plans to d/c drain today   Hypertensive emergency  BP as high as 177/112 . SBP BP goal < 160 . on Cleviprex  . Labetalol PRN IV . Put on lisinopril 20 mg bid and labetalol 100 tid . Close monitoring  Cocaine abuse  UDS positive for cocaine  Cocaine cessation education will be provided  Other Active Problems  Hyperglycemia  Hospital day # 5     I have personally examined this patient, reviewed notes, independently viewed imaging studies, participated in medical decision making and plan of care.ROS completed by me personally and pertinent positives fully documented  I have made any additions or clarifications directly to the above note.  .The   Family has agreed to DNR and plan goals of care meeting on Friday after sister arrives from [place] discuss with Dr. Sherryll Burger critical care medicine This patient is critically ill and at significant risk of neurological worsening, death and care requires constant monitoring of vital signs, hemodynamics,respiratory and cardiac monitoring, extensive review of multiple databases, frequent neurological assessment, discussion with family, other specialists and medical decision making of high complexity.I have made any additions or clarifications directly to the above note.This critical care time does not reflect procedure time, or teaching time or supervisory time of PA/NP/Med Resident etc but could involve care discussion time.  I spent 35  minutes of neurocritical care time  in the care of  this patient.      Delia Heady, MD Medical Director [facility] Pager: [phone] 05/20/2017 2:20 PM   To contact Stroke Continuity provider, please refer to [email or web address]. After hours, contact General Neurology

## 2017-05-20 NOTE — Progress Notes (Signed)
PULMONARY / CRITICAL CARE MEDICINE   Name: Maimouna Rondeau MRN: [id number] DOB: 12-14-1965    ADMISSION DATE:  2017-06-13  DISCUSSION:      This is a 52 year old who presented with hemiparesis and was found to have a basal ganglia hemorrhage associated with extreme hypertension.  Toxicology screen was positive for cocaine.  ASSESSMENT / PLAN:  PULMONARY A: VDRF for airway protection - Begin PS but no extubation given mental status- Doing well on PS, No abg done today will get one tomorrow - Titrate O2 for sat of 88-92% - Follow family lead and decide on further trach at that point possible comfort extubation per notes tomorrow  NEUROLOGIC A: Right basal ganglia hemorrhage with ventricular extension.  EVD is at +10.   Hypertonic saline is on hold as NA is >150 Plan for removal of EVD noted on 4/17  Fever: Possible lll air space ? Central fever Sputum culture is pending Blood culture is negaitve for 24 hours  On VZ and if culture negative DC vanco tomorrow  PLAN:  CVS:BP is still elevated on clevadipine drip, Labetalol po increaed, As cocaine on board benzo helps a lot with BP will use some ativan if needed WU:[id number] vent support no plan for extubation dueto mental status issues  -- Aspiration precaution -- Vent protocol on vented patient  ID:VZ AS ABOVE -- Follow culture and adjust ENDO: -- SSI monitor blood sugar WG:NFAO feeds as tolerated  RENAL:k replaced  -- Electrolyte replacement protocol, Monitor Cr and K  -- Monitor HB and PLT  Skin/Wound: chronic changes   Electrolytes: Replace electrolytes per ICU electrolyte replacement protocol.   IVF: none  Nutrition: tube feeds   Prophylaxis: DVT Prophylaxis with lovenox,. GI Prophylaxis.   Restraints: none  PT/OT eval and treat. OOB when appropriate.   Lines/Tubes:  4/14 foley  Picc right arm 4/14 ADVANCE DIRECTIVE:DNR awaiting family to arrieve tomorrow FAMILY DISCUSSION:Primary team spoke with family  Quality  Care: PPI, DVT prophylaxis, HOB elevated, Infection control all reviewed and addressed. Events and notes from last 24 hours reviewed. Care plan discussed on multidisciplinary rounds CC TIME:35 min      Old records reviewed discussed results and management plan with patient  Images personally reviewed and results and labs reviewed and discussed with patient.  All medication reviewed and adjusted  Further management depending on test results and work up as outlined above.   HISTORY OF PRESENT ILLNESS: This is a 52 year old who presented with left hemiparesis on 4/13.  She was found to have a right basal ganglia hemorrhage with extension into the ventricular system.  She developed mass effect from the bleed and was placed on hypertonic saline.  An EVD was placed. Tox screen was positive for cocaine She is still on Cleviprex infusion for control of her blood pressure.  05/20/17 Minimally decorticating to pain ful stimulie Notes from neurology reviewed suggested DNR status awaiting family to arrieve tomorrow and might consider comfort care at that point CXR with lll air space disease on v+Z  BP still elevated and on clevadipine drip  Studies: Blood culture negative 24 hours  Antibiotics: On VZ started since 4/16      SUBJECTIVE:  No events overnight, remains completely unresponsive  VITAL SIGNS: BP (!) 168/83   Pulse (!) 105   Temp 98.7 F (37.1 C) (Axillary)   Resp (!) 25   Ht 5\' 3"  (1.6 m)   Wt 66.7 kg (147 lb 0.8 oz)   LMP  (LMP Unknown)  SpO2 100%   BMI 26.05 kg/m   HEMODYNAMICS:    VENTILATOR SETTINGS: Vent Mode: PSV;CPAP FiO2 (%):  [40 %] 40 % Set Rate:  [24 bmp] 24 bmp Vt Set:  [420 mL] 420 mL PEEP:  [5 cmH20] 5 cmH20 Pressure Support:  [10 cmH20-15 cmH20] 10 cmH20 Plateau Pressure:  [13 cmH20-16 cmH20] 16 cmH20  INTAKE / OUTPUT: I/O last 3 completed shifts: In: 3144.5 [I.V.:944.5; NG/GT:1800; IV Piggyback:400] Out: 3530 [Urine:3530]  PHYSICAL  EXAMINATION: General: Intubated, not following commands Neuro:not following commands, opens eyes Cardiovascular: RRR, Nl S1/S2 and -M/R/G Lungs: Coarse BS diffusely Abdomen: Soft, NT, ND and +BS Musculoskeletal: -edema and -tenderness Skin: intact  LABS:  BMET Recent Labs  Lab 05/18/17 0417  05/18/17 1517 05/18/17 1941 05/19/17 0217 05/20/17 0500  NA 157*   < > 152* 155* 153* 152*  K 3.2*  --  3.8  --  3.6 3.8  CL 130*  --   --   --  121* 119*  CO2 18*  --   --   --  23 24  BUN 18  --   --   --  19 18  CREATININE 1.32*  --   --   --  0.96 0.74  GLUCOSE 156*  --   --   --  135* 168*   < > = values in this interval not displayed.    Electrolytes Recent Labs  Lab 05/18/17 0417 05/18/17 1517 05/19/17 0217 05/20/17 0500  CALCIUM 8.9  --  8.5* 8.8*  MG 1.8 1.6* 1.7 2.2  PHOS <1.0* 4.3 4.3 2.9    CBC Recent Labs  Lab 05/18/17 0417 05/19/17 0217 05/20/17 0500  WBC 14.1* 14.7* 12.4*  HGB 10.9* 11.5* 12.5  HCT 34.7* 36.2 39.1  PLT 113* 114* 134*    Coag's Recent Labs  Lab 05/29/2017 1120 05/20/17 0500  APTT 25 28  INR 1.02 1.00    Sepsis Markers Recent Labs  Lab 05/18/17 1147 05/19/17 0217 05/20/17 0500  PROCALCITON 0.10 <0.10 <0.10    ABG Recent Labs  Lab 05/16/17 0040 05/16/17 1122 05/18/17 0340  PHART 7.388 7.358 7.462*  PCO2ART 34.8 39.9 29.2*  PO2ART 414* 160* 74.7*    Liver Enzymes Recent Labs  Lab 05/23/2017 1120 05/20/17 0500  AST 22 21  ALT 13* 20  ALKPHOS 87 63  BILITOT 1.1 0.8  ALBUMIN 4.0 2.6*    Cardiac Enzymes No results for input(s): TROPONINI, PROBNP in the last 168 hours.  Glucose Recent Labs  Lab 05/19/17 1150 05/19/17 1529 05/19/17 1954 05/20/17 0013 05/20/17 0335 05/20/17 0810  GLUCAP 145* 180* 139* 168* 138* 128*    Imaging Dg Chest Port 1 View  Result Date: 05/20/2017 CLINICAL DATA:  Intubated EXAM: PORTABLE CHEST 1 VIEW COMPARISON:  05/19/2017 FINDINGS: Support devices are stable. Airspace disease  in the left lower lobe is stable. Right lung clear. Heart is normal size. No visible effusions. IMPRESSION: Continued left lower lobe atelectasis or pneumonia, similar to prior study. Electronically Signed   By: Charlett NoseKevin  Dover M.D.   On: 05/20/2017 09:12     Roseanne Renoutul Enyah Moman, M.D    05/20/2017, 11:10 AM

## 2017-05-21 DIAGNOSIS — R509 Fever, unspecified: Secondary | ICD-10-CM

## 2017-05-21 DIAGNOSIS — J9600 Acute respiratory failure, unspecified whether with hypoxia or hypercapnia: Secondary | ICD-10-CM

## 2017-05-21 LAB — BLOOD GAS, ARTERIAL
Acid-Base Excess: 2.6 mmol/L — ABNORMAL HIGH (ref 0.0–2.0)
BICARBONATE: 26.6 mmol/L (ref 20.0–28.0)
Drawn by: 414221
FIO2: 40
LHR: 24 {breaths}/min
O2 Saturation: 98.1 %
PATIENT TEMPERATURE: 98.6
PCO2 ART: 41.1 mmHg (ref 32.0–48.0)
PEEP: 5 cmH2O
PO2 ART: 113 mmHg — AB (ref 83.0–108.0)
VT: 420 mL
pH, Arterial: 7.427 (ref 7.350–7.450)

## 2017-05-21 LAB — GLUCOSE, CAPILLARY
GLUCOSE-CAPILLARY: 111 mg/dL — AB (ref 65–99)
GLUCOSE-CAPILLARY: 156 mg/dL — AB (ref 65–99)
GLUCOSE-CAPILLARY: 128 mg/dL — AB (ref 65–99)
Glucose-Capillary: 170 mg/dL — ABNORMAL HIGH (ref 65–99)

## 2017-05-21 LAB — BASIC METABOLIC PANEL
Anion gap: 8 (ref 5–15)
BUN: 16 mg/dL (ref 6–20)
CALCIUM: 8.8 mg/dL — AB (ref 8.9–10.3)
CO2: 25 mmol/L (ref 22–32)
CREATININE: 0.57 mg/dL (ref 0.44–1.00)
Chloride: 115 mmol/L — ABNORMAL HIGH (ref 101–111)
GFR calc non Af Amer: >60 mL/min (ref >60)
Glucose, Bld: 162 mg/dL — ABNORMAL HIGH (ref 65–99)
Potassium: 3.7 mmol/L (ref 3.5–5.1)
SODIUM: 148 mmol/L — AB (ref 135–145)

## 2017-05-21 LAB — CBC WITH DIFFERENTIAL/PLATELET
BASOS ABS: 0 10*3/uL (ref 0.0–0.1)
BASOS PCT: 0 %
Eosinophils Absolute: 0.2 10*3/uL (ref 0.0–0.7)
Eosinophils Relative: 2 %
HEMATOCRIT: 40.1 % (ref 36.0–46.0)
Hemoglobin: 12.7 g/dL (ref 12.0–15.0)
LYMPHS PCT: 16 %
Lymphs Abs: 1.6 10*3/uL (ref 0.7–4.0)
MCH: 29.1 pg (ref 26.0–34.0)
MCHC: 31.7 g/dL (ref 30.0–36.0)
MCV: 92 fL (ref 78.0–100.0)
MONO ABS: 0.8 10*3/uL (ref 0.1–1.0)
Monocytes Relative: 8 %
NEUTROS ABS: 7.1 10*3/uL (ref 1.7–7.7)
Neutrophils Relative %: 74 %
Platelets: 169 10*3/uL (ref 150–400)
RBC: 4.36 MIL/uL (ref 3.87–5.11)
RDW: 13.9 % (ref 11.5–15.5)
WBC: 9.7 10*3/uL (ref 4.0–10.5)

## 2017-05-21 LAB — PHOSPHORUS: PHOSPHORUS: 3.2 mg/dL (ref 2.5–4.6)

## 2017-05-21 LAB — MAGNESIUM: Magnesium: 2.3 mg/dL (ref 1.7–2.4)

## 2017-05-21 MED ORDER — PIPERACILLIN-TAZOBACTAM 3.375 G IVPB
3.3750 g | Freq: Three times a day (TID) | INTRAVENOUS | Status: DC
  Administered 2017-05-21 – 2017-05-22 (×3): 3.375 g via INTRAVENOUS
  Filled 2017-05-21 (×4): qty 50

## 2017-05-21 NOTE — Progress Notes (Signed)
PULMONARY / CRITICAL CARE MEDICINE   Name: Kendra Barry MRN: [id number] DOB: 09/26/1965    ADMISSION DATE:  22-Oct-2017  DISCUSSION:      This is a 52 year old who presented with hemiparesis and was found to have a basal ganglia hemorrhage associated with extreme hypertension.  Toxicology screen was positive for cocaine.  ASSESSMENT / PLAN:  PULMONARY A: VDRF for airway protection - Begin PS but no extubation given mental status- Doing well on PS, ABG acceptable- Titrate O2 for sat of 88-92% - Follow family lead seems like they are waiting on family member to arrieve and possible comfort measure tomorrow  NEUROLOGIC A: Right basal ganglia hemorrhage with ventricular extension.  EVD is at +10.   Hypertonic saline is on hold as NA is >150 Plan for removal of EVD noted on 4/17  Fever: Possible lll air space ? Central fever Sputum culture is pending Blood culture is negaitve for 48 hours  Dc vanco for now if family decides on comfort measures all antibiotics can be stopped  PLAN:  CVS:BP is still elevated on clevadipine drip, Labetalol po increaed, As cocaine on board benzo helps a lot with BP will use some ativan if needed OZ:[id number]:Continue vent support no plan for extubation dueto mental status issues  -- Aspiration precaution -- Vent protocol on vented patient  ID:Z AS ABOVE -- Follow culture and adjust ENDO: -- SSI monitor blood sugar ON:GEXBGI:Tube feeds as tolerated  RENAL:k replaced  -- Electrolyte replacement protocol, Monitor Cr and K  -- Monitor HB and PLT  Skin/Wound: chronic changes   Electrolytes: Replace electrolytes per ICU electrolyte replacement protocol.   IVF: none  Nutrition: tube feeds   Prophylaxis: DVT Prophylaxis with lovenox,. GI Prophylaxis.   Restraints: none  PT/OT eval and treat. OOB when appropriate.   Lines/Tubes:  4/14 foley  Picc right arm 4/14 ADVANCE DIRECTIVE:DNR awaiting family to arrieve tomorrow FAMILY DISCUSSION:Primary team spoke with  family  Quality Care: PPI, DVT prophylaxis, HOB elevated, Infection control all reviewed and addressed. Events and notes from last 24 hours reviewed. Care plan discussed on multidisciplinary rounds CC TIME:32 min      Old records reviewed discussed results and management plan with patient  Images personally reviewed and results and labs reviewed and discussed with patient.  All medication reviewed and adjusted  Further management depending on test results and work up as outlined above.   HISTORY OF PRESENT ILLNESS: This is a 52 year old who presented with left hemiparesis on 4/13.  She was found to have a right basal ganglia hemorrhage with extension into the ventricular system.  She developed mass effect from the bleed and was placed on hypertonic saline.  An EVD was placed. Tox screen was positive for cocaine She is still on Cleviprex infusion for control of her blood pressure.  05/21/17 Minimally decorticating to pain ful stimulie Notes from neurology reviewed suggested DNR status awaiting family to arrieve tomorrow and might consider comfort care at that point CXR with lll air space disease on v+Z -- NO MRSA identified BP still elevated and on clevadipine drip  Studies: Blood culture >>  Antibiotics: On VZ started since 4/16 -- Plan to stop vanco on 4/19     SUBJECTIVE:  No events overnight, remains completely unresponsive  VITAL SIGNS: BP 135/77   Pulse 97   Temp 98.7 F (37.1 C)   Resp (!) 23   Ht 5\' 3"  (1.6 m)   Wt 65.4 kg (144 lb 2.9 oz)   LMP  (  LMP Unknown)   SpO2 100%   BMI 25.54 kg/m   HEMODYNAMICS:    VENTILATOR SETTINGS: Vent Mode: PSV;CPAP FiO2 (%):  [40 %] 40 % Set Rate:  [24 bmp] 24 bmp Vt Set:  [420 mL] 420 mL PEEP:  [5 cmH20] 5 cmH20 Pressure Support:  [10 cmH20] 10 cmH20 Plateau Pressure:  [11 cmH20-15 cmH20] 15 cmH20  INTAKE / OUTPUT: I/O last 3 completed shifts: In: 3329.4 [I.V.:929.4; NG/GT:1800; IV Piggyback:600] Out: 4365  [Urine:4365]  PHYSICAL EXAMINATION: General: Intubated, not following commands Neuro:not following commands, opens eyes Cardiovascular: RRR, Nl S1/S2 and -M/R/G Lungs: Coarse BS diffusely Abdomen: Soft, NT, ND and +BS Musculoskeletal: -edema and -tenderness Skin: intact  LABS:  BMET Recent Labs  Lab 05/19/17 0217 05/20/17 0500 05/21/17 0327  NA 153* 152* 148*  K 3.6 3.8 3.7  CL 121* 119* 115*  CO2 23 24 25   BUN 19 18 16   CREATININE 0.96 0.74 0.57  GLUCOSE 135* 168* 162*    Electrolytes Recent Labs  Lab 05/19/17 0217 05/20/17 0500 05/21/17 0327  CALCIUM 8.5* 8.8* 8.8*  MG 1.7 2.2 2.3  PHOS 4.3 2.9 3.2    CBC Recent Labs  Lab 05/19/17 0217 05/20/17 0500 05/21/17 0327  WBC 14.7* 12.4* 9.7  HGB 11.5* 12.5 12.7  HCT 36.2 39.1 40.1  PLT 114* 134* 169    Coag's Recent Labs  Lab 05/09/2017 1120 05/20/17 0500  APTT 25 28  INR 1.02 1.00    Sepsis Markers Recent Labs  Lab 05/18/17 1147 05/19/17 0217 05/20/17 0500  PROCALCITON 0.10 <0.10 <0.10    ABG Recent Labs  Lab 05/16/17 1122 05/18/17 0340 05/21/17 0316  PHART 7.358 7.462* 7.427  PCO2ART 39.9 29.2* 41.1  PO2ART 160* 74.7* 113*    Liver Enzymes Recent Labs  Lab 05/09/2017 1120 05/20/17 0500  AST 22 21  ALT 13* 20  ALKPHOS 87 63  BILITOT 1.1 0.8  ALBUMIN 4.0 2.6*    Cardiac Enzymes No results for input(s): TROPONINI, PROBNP in the last 168 hours.  Glucose Recent Labs  Lab 05/20/17 1153 05/20/17 1711 05/20/17 2042 05/20/17 2329 05/21/17 0346 05/21/17 0729  GLUCAP 131* 126* 121* 131* 111* 156*    Imaging No results found.   Roseanne Reno, M.D    05/21/2017, 10:48 AM

## 2017-05-21 NOTE — Progress Notes (Signed)
STROKE TEAM PROGRESS NOTE   SUBJECTIVE (INTERVAL HISTORY) Remains intubated and sedated. Temp controled.Her sister and daughter are at the bedside.The  Family  Is ready to withdraw care tomorrow morning after her sister arrives tonight and spends some time with her   OBJECTIVE Temp:  [97.4 F (36.3 C)-99.7 F (37.6 C)] 98.2 F (36.8 C) (04/19 1600) Pulse Rate:  [80-125] 80 (04/19 1500) Cardiac Rhythm: Sinus tachycardia (04/19 1200) Resp:  [18-42] 25 (04/19 1500) BP: (130-189)/(72-105) 135/76 (04/19 1500) SpO2:  [96 %-100 %] 100 % (04/19 1610) FiO2 (%):  [40 %] 40 % (04/19 1610) Weight:  [144 lb 2.9 oz (65.4 kg)] 144 lb 2.9 oz (65.4 kg) (04/19 0331)  CBC:  Recent Labs  Lab 05/20/17 0500 05/21/17 0327  WBC 12.4* 9.7  NEUTROABS 9.8* 7.1  HGB 12.5 12.7  HCT 39.1 40.1  MCV 91.4 92.0  PLT 134* 169    Basic Metabolic Panel:  Recent Labs  Lab 05/20/17 0500 05/21/17 0327  NA 152* 148*  K 3.8 3.7  CL 119* 115*  CO2 24 25  GLUCOSE 168* 162*  BUN 18 16  CREATININE 0.74 0.57  CALCIUM 8.8* 8.8*  MG 2.2 2.3  PHOS 2.9 3.2    Lipid Panel:     Component Value Date/Time   CHOL 149 05/17/2017 0430   TRIG 85 05/19/2017 0217   HDL 42 05/17/2017 0430   CHOLHDL 3.5 05/17/2017 0430   VLDL 34 05/17/2017 0430   LDLCALC 73 05/17/2017 0430   HgbA1c:  Lab Results  Component Value Date   HGBA1C 5.4 05/08/2017   Urine Drug Screen:     Component Value Date/Time   LABOPIA NONE DETECTED 05/19/2017 1302   COCAINSCRNUR POSITIVE (A) 05/19/2017 1302   LABBENZ NONE DETECTED 05/12/2017 1302   AMPHETMU NONE DETECTED 05/17/2017 1302   THCU NONE DETECTED 05/28/2017 1302   LABBARB NONE DETECTED 05/31/2017 1302    Alcohol Level     Component Value Date/Time   ETH <10 05/16/2017 1235    IMAGING Dg Chest Port 1 View  Result Date: 05/20/2017 CLINICAL DATA:  Intubated EXAM: PORTABLE CHEST 1 VIEW COMPARISON:  05/19/2017 FINDINGS: Support devices are stable. Airspace disease in the  left lower lobe is stable. Right lung clear. Heart is normal size. No visible effusions. IMPRESSION: Continued left lower lobe atelectasis or pneumonia, similar to prior study. Electronically Signed   By: Charlett NoseKevin  Dover M.D.   On: 05/20/2017 09:12   Transthoracic Echocardiogram  - Left ventricle: The cavity size was normal. There was severe concentric hypertrophy. Systolic function was vigorous. The estimated ejection fraction was in the range of 65% to 70%. There was dynamic obstruction at restin the mid cavity, with mid-cavity obliteration and a peak gradient of 46 mm Hg. Wall motion was normal; there were no regional wall motion abnormalities. Features are consistent with a pseudonormal left ventricular filling pattern, with concomitant abnormal relaxation and increased filling pressure (grade 2 diastolic dysfunction). Doppler parameters are consistent with high ventricular filling pressure.   PHYSICAL EXAM General -cachectic, well developed, intubated,   Cardiovascular - Regular rate and rhythm.  Neuro - intubated, on ventilation, eyes closed, not following commands.  with forced eye opening, eyes with downward gaze to the left, pupil bilateral pinpoint, nonreactive to light, sluggish doll's eyes, not blinking to visual threat bilaterally, not tracking objects, corneal reflex present, positive gag and cough.  On pain stimulation right upper extremity proximal movement, left upper extremity posturing with intermittent mild withdraw to pain.  On pain summation right greater than left lower extremity withdraw to pain however not able to against gravity.  DTR 1+, Babinski bilaterally positive. Sensation, coordination and gait not tested. R EVD   ASSESSMENT/PLAN Ms. Kendra Barry is a 52 y.o. female with unknown medical history presenting with left-sided weakness, confusion, left facial droop, nausea and vomiting with urine incontinence. .   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 No source of embolus   LDL 73  HgbA1c 5.4  VTE prophylaxis -SCDs. Change to Lovenox 40 mg sq daily. Ok to use in ICH pt once stable Diet NPO time specified Fall precautions  No antithrombotic prior to admission, now on No antithrombotic  Ongoing aggressive stroke risk factor management  Therapy recommendations:  pending  Disposition:  Pending   DNR. Continue current treatment. Do not escalate. Family considering withdrawal on Friday when other sister arrives  Cerebral edema  Midline shift CT had  Concerned ICP elevated given downward eye deviation  Intermittent 23.4% saline  On 3% saline at 75 cc/h  Na 157. 3% on hold 4/15  Sodium goal 150-155  Sodium every 6h  Allows sodium to drift down. Resume 3% if drifts less than 150  Respiratory failure  Intubated for airway protection  On propofol  CCM on board  Elevated temperature  Temp 102.4, 103.1  Central fever, secondary to stroke  Start artic sun.   Discussed with CCM who will place TTM order set  Goal normothermia  Obstructive hydrocephalus  Due to IVH involving lateral, third and fourth ventricles  Status post EVD  Little EVD drainage. Hydrocephalus not an issue  Ok with Dr. Conchita Paris plans to d/c drain today   Hypertensive emergency  BP as high as 177/112 . SBP BP goal < 160 . on Cleviprex  . Labetalol PRN IV . Put on lisinopril 20 mg bid and labetalol 100 tid . Close monitoring  Cocaine abuse  UDS positive for cocaine  Cocaine cessation education will be provided  Other Active Problems  Hyperglycemia  Hospital day # 6     I have personally examined this patient, reviewed notes, independently viewed imaging studies, participated in medical  decision making and plan of care.ROS completed by me personally and pertinent positives fully documented  I have made any additions or clarifications directly to the above note.  .The   Family has agreed to DNR and plan withdrawal of care tomorrow am after sister arrives from [facility].Discussed with Dr. Sherryll Burger critical care medicine and daughter and sister at bedside. This patient is critically ill and at significant risk of neurological worsening, death and care requires constant monitoring of vital signs, hemodynamics,respiratory and cardiac monitoring, extensive review of multiple databases, frequent neurological assessment, discussion with family, other specialists and medical decision making of high complexity.I have made any additions or clarifications directly to the above note.This critical care time does not reflect procedure time, or teaching time or supervisory time of PA/NP/Med Resident etc but could involve care discussion time.  I spent 30 minutes of neurocritical care time  in the care of  this patient.      Delia Heady, MD Medical Director [facility] Pager: [phone] 05/21/2017 4:50 PM   To contact  Stroke Continuity provider, please refer to [email or web address] After hours, contact General Neurology

## 2017-05-22 DIAGNOSIS — J9600 Acute respiratory failure, unspecified whether with hypoxia or hypercapnia: Secondary | ICD-10-CM

## 2017-05-22 LAB — TRIGLYCERIDES: TRIGLYCERIDES: 78 mg/dL (ref <150)

## 2017-05-22 LAB — GLUCOSE, CAPILLARY
Glucose-Capillary: 152 mg/dL — ABNORMAL HIGH (ref 65–99)
Glucose-Capillary: 138 mg/dL — ABNORMAL HIGH (ref 65–99)
Glucose-Capillary: 149 mg/dL — ABNORMAL HIGH (ref 65–99)
Glucose-Capillary: 157 mg/dL — ABNORMAL HIGH (ref 65–99)

## 2017-05-22 LAB — SODIUM: SODIUM: 147 mmol/L — AB (ref 135–145)

## 2017-05-22 MED ORDER — SODIUM CHLORIDE 0.9 % IV SOLN
10.0000 mg/h | INTRAVENOUS | Status: DC
  Filled 2017-05-22: qty 10

## 2017-05-22 MED ORDER — MORPHINE 100MG IN NS 100ML (1MG/ML) PREMIX INFUSION
10.0000 mg/h | INTRAVENOUS | Status: DC
  Administered 2017-05-22: 10 mg/h via INTRAVENOUS
  Filled 2017-05-22 (×2): qty 100

## 2017-05-22 MED ORDER — MIDAZOLAM BOLUS VIA INFUSION (WITHDRAWAL LIFE SUSTAINING TX)
5.0000 mg | INTRAVENOUS | Status: DC | PRN
  Filled 2017-05-22: qty 20

## 2017-05-22 MED ORDER — MORPHINE BOLUS VIA INFUSION
5.0000 mg | INTRAVENOUS | Status: DC | PRN
  Filled 2017-05-22: qty 20

## 2017-05-22 MED ORDER — MIDAZOLAM HCL 50 MG/10ML IJ SOLN
10.0000 mg/h | INTRAMUSCULAR | Status: DC
  Administered 2017-05-22 (×2): 10 mg/h via INTRAVENOUS
  Filled 2017-05-22 (×2): qty 10

## 2017-05-22 MED ORDER — CHLORHEXIDINE GLUCONATE 0.12 % MT SOLN
OROMUCOSAL | Status: AC
  Administered 2017-05-22: 08:00:00
  Filled 2017-05-22: qty 15

## 2017-05-23 LAB — CULTURE, BLOOD (ROUTINE X 2)
Culture: NO GROWTH
Culture: NO GROWTH
SPECIAL REQUESTS: ADEQUATE
Special Requests: ADEQUATE

## 2017-06-02 NOTE — Progress Notes (Signed)
PULMONARY / CRITICAL CARE MEDICINE   Name: Kendra Barry MRN: [id number] DOB: [id number]    ADMISSION DATE:  05/24/2017  DISCUSSION:      This is a 52 year old who presented with hemiparesis and was found to have a basal ganglia hemorrhage associated with extreme hypertension.  Toxicology screen was positive for cocaine.  ASSESSMENT / PLAN:  PULMONARY A: VDRF for airway protection And withdrawal of care today NEUROLOGIC A: Right basal ganglia hemorrhage with ventricular extension.  EVD is at +10.   Transitioning to comfort care  Fever: Possible lll air space Currently on comfort care  PLAN:   07-Aug-2017 9:55 AM withdrawal of care orders written morphine drip to start and endotracheal tube to be removed  IVF: none  Nutrition: tube feeds   Prophylaxis: DVT Prophylaxis with lovenox,. GI Prophylaxis.   Restraints: none  PT/OT eval and treat. OOB when appropriate.   Lines/Tubes:  4/14 foley  Picc right arm 4/14 ADVANCE DIRECTIVE: Withdrawal care today 07-Aug-2017 FAMILY DISCUSSION:Primary team spoke with family .       HISTORY OF PRESENT ILLNESS: This is a 52 year old who presented with left hemiparesis on 4/13.  She was found to have a right basal ganglia hemorrhage with extension into the ventricular system.  She developed mass effect from the bleed and was placed on hypertonic saline.  An EVD was placed. Tox screen was positive for cocaine She is still on Cleviprex infusion for control of her blood pressure.  05/21/17 Minimally decorticating to pain ful stimulie Notes from neurology reviewed suggested DNR status awaiting family to arrieve tomorrow and might consider comfort care at that point CXR with lll air space disease on v+Z -- NO MRSA identified BP still elevated and on clevadipine drip  Studies: Blood culture >>  Antibiotics: On VZ started since 4/16 --  stopped vanco on 4/19.  Remains on Zosyn     SUBJECTIVE:  Remains unresponsive.  There has been mention of  possible withdrawal of care 07-Aug-2017 depending on family's wishes.  This will be controlled by the neurological service.  VITAL SIGNS: BP (!) 166/83   Pulse 95   Temp (!) 96.2 F (35.7 C)   Resp (!) 27   Ht 5\' 3"  (1.6 m)   Wt 68.8 kg (151 lb 10.8 oz)   LMP  (LMP Unknown)   SpO2 100%   BMI 26.87 kg/m   HEMODYNAMICS:    VENTILATOR SETTINGS: Vent Mode: CPAP;PSV FiO2 (%):  [40 %] 40 % Set Rate:  [24 bmp] 24 bmp Vt Set:  [420 mL] 420 mL PEEP:  [5 cmH20] 5 cmH20 Pressure Support:  [8 cmH20-10 cmH20] 8 cmH20 Plateau Pressure:  [15 cmH20] 15 cmH20  INTAKE / OUTPUT: I/O last 3 completed shifts: In: 2660.1 [I.V.:410.1; NG/GT:1900; IV Piggyback:350] Out: 2395 [Urine:2395]  PHYSICAL EXAMINATION: General: Frail cachectic female  HEENT: Tracheal tube to ventilator PSY: Neuro: Not available to motions right CV: Heart sounds are PULM: Rhonchi bilaterally WU:JWJXGI:soft, non-tender, bsx4 active  Extremities: warm/dry- edema  Skin: no rashes or lesions   LABS:  BMET Recent Labs  Lab 05/19/17 0217 05/20/17 0500 05/21/17 0327 14-Dec-2017 0352  NA 153* 152* 148* 147*  K 3.6 3.8 3.7  --   CL 121* 119* 115*  --   CO2 23 24 25   --   BUN 19 18 16   --   CREATININE 0.96 0.74 0.57  --   GLUCOSE 135* 168* 162*  --     Electrolytes Recent Labs  Lab 05/19/17 0217  05/20/17 0500 05/21/17 0327  CALCIUM 8.5* 8.8* 8.8*  MG 1.7 2.2 2.3  PHOS 4.3 2.9 3.2    CBC Recent Labs  Lab 05/19/17 0217 05/20/17 0500 05/21/17 0327  WBC 14.7* 12.4* 9.7  HGB 11.5* 12.5 12.7  HCT 36.2 39.1 40.1  PLT 114* 134* 169    Coag's Recent Labs  Lab 2017-06-05 1120 05/20/17 0500  APTT 25 28  INR 1.02 1.00    Sepsis Markers Recent Labs  Lab 05/18/17 1147 05/19/17 0217 05/20/17 0500  PROCALCITON 0.10 <0.10 <0.10    ABG Recent Labs  Lab 05/16/17 1122 05/18/17 0340 05/21/17 0316  PHART 7.358 7.462* 7.427  PCO2ART 39.9 29.2* 41.1  PO2ART 160* 74.7* 113*    Liver Enzymes Recent  Labs  Lab 2017-06-05 1120 05/20/17 0500  AST 22 21  ALT 13* 20  ALKPHOS 87 63  BILITOT 1.1 0.8  ALBUMIN 4.0 2.6*    Cardiac Enzymes No results for input(s): TROPONINI, PROBNP in the last 168 hours.  Glucose Recent Labs  Lab 05/21/17 0346 05/21/17 0729 05/21/17 1131 05/21/17 1528 05/20/2017 0410 05/17/2017 0724  GLUCAP 111* 156* 128* 170* 152* 138*    Imaging No results found.  App cct 30 min  Brett Canales Leighla Chestnutt ACNP [facility] Pager [phone] till 1 pm If no answer page [phone] 05/30/2017, 9:50 AM

## 2017-06-02 NOTE — Death Summary Note (Signed)
Stroke Discharge Summary  Patient ID: Kendra Barry    l   MRN: [id number]      DOB: 05/01/1965  Date of Admission: 12-11-17 Date of Discharge: 05/13/2017  Attending Physician:  Stroke MD Consultant(s):   Treatment Team:  Roseanne RenoShah, Rutul, MD Neurosurgery  Patient's PCP:  No primary care provider on file.  DISCHARGE DIAGNOSIS:  Active Problems:   Right BG large ICH (intracerebral hemorrhage) (HCC)   IVH   Cerebral edema   Acute respiratory failure (HCC)   Cocaine abuse   Hypertensive emergency   History reviewed. No pertinent past medical history. History reviewed. No pertinent surgical history.  Allergies as of 05/31/2017   Not on File     Medication List    You have not been prescribed any medications.     LABORATORY STUDIES CBC    Component Value Date/Time   WBC 9.7 05/21/2017 0327   RBC 4.36 05/21/2017 0327   HGB 12.7 05/21/2017 0327   HCT 40.1 05/21/2017 0327   PLT 169 05/21/2017 0327   MCV 92.0 05/21/2017 0327   MCH 29.1 05/21/2017 0327   MCHC 31.7 05/21/2017 0327   RDW 13.9 05/21/2017 0327   LYMPHSABS 1.6 05/21/2017 0327   MONOABS 0.8 05/21/2017 0327   EOSABS 0.2 05/21/2017 0327   BASOSABS 0.0 05/21/2017 0327   CMP    Component Value Date/Time   NA 147 (H) 06/01/2017 0352   K 3.7 05/21/2017 0327   CL 115 (H) 05/21/2017 0327   CO2 25 05/21/2017 0327   GLUCOSE 162 (H) 05/21/2017 0327   BUN 16 05/21/2017 0327   CREATININE 0.57 05/21/2017 0327   CALCIUM 8.8 (L) 05/21/2017 0327   PROT 7.1 05/20/2017 0500   ALBUMIN 2.6 (L) 05/20/2017 0500   AST 21 05/20/2017 0500   ALT 20 05/20/2017 0500   ALKPHOS 63 05/20/2017 0500   BILITOT 0.8 05/20/2017 0500   GFRNONAA >60 05/21/2017 0327   GFRAA >60 05/21/2017 0327   COAGS Lab Results  Component Value Date   INR 1.00 05/20/2017   INR 1.02 011-09-19   Lipid Panel    Component Value Date/Time   CHOL 149 05/17/2017 0430   TRIG 78 05/17/2017 0352   HDL 42 05/17/2017 0430   CHOLHDL 3.5 05/17/2017 0430    VLDL 34 05/17/2017 0430   LDLCALC 73 05/17/2017 0430   HgbA1C  Lab Results  Component Value Date   HGBA1C 5.4 011-09-19   Urinalysis    Component Value Date/Time   COLORURINE YELLOW 05/18/2017 1523   APPEARANCEUR HAZY (A) 05/18/2017 1523   LABSPEC 1.016 05/18/2017 1523   PHURINE 5.0 05/18/2017 1523   GLUCOSEU 50 (A) 05/18/2017 1523   HGBUR MODERATE (A) 05/18/2017 1523   BILIRUBINUR NEGATIVE 05/18/2017 1523   KETONESUR NEGATIVE 05/18/2017 1523   PROTEINUR 30 (A) 05/18/2017 1523   NITRITE NEGATIVE 05/18/2017 1523   LEUKOCYTESUR NEGATIVE 05/18/2017 1523   Urine Drug Screen     Component Value Date/Time   LABOPIA NONE DETECTED 011-09-19 1302   COCAINSCRNUR POSITIVE (A) 011-09-19 1302   LABBENZ NONE DETECTED 011-09-19 1302   AMPHETMU NONE DETECTED 011-09-19 1302   THCU NONE DETECTED 011-09-19 1302   LABBARB NONE DETECTED 011-09-19 1302    Alcohol Level    Component Value Date/Time   ETH <10 011-09-19 1235     SIGNIFICANT DIAGNOSTIC STUDIES Ct Head Wo Contrast  Result Date: 05/19/2017 CLINICAL DATA:  Follow-up examination for acute stroke. EXAM: CT HEAD WITHOUT CONTRAST TECHNIQUE: Contiguous axial images were  obtained from the base of the skull through the vertex without intravenous contrast. COMPARISON:  Prior CT from 05/17/2017. FINDINGS: Brain: Continued interval evolution of acute intraparenchymal hemorrhage centered at the right thalamus, measuring 3.7 x 3.1 x 3.4 cm on today's exam (estimated volume 19.5 cc, previously 17 cc surrounding low-density vasogenic edema with regional mass effect. Localized right-to-left midline shift measuring up to 8 mm. Intraventricular extension with blood seen layering within the lateral, third, and fourth ventricles, similar to previous. There has been interval removal of a right frontal approach ventriculostomy catheter. Obstructive hydrocephalus is slightly worsened from previous. Increased in involving intraparenchymal hemorrhage  along the right frontal ventriculostomy tract with localized edema. No other acute intracranial hemorrhage. Small remote left cerebellar infarct noted. Suspected chronic right basal ganglia lacunar infarct noted as well. No acute large vessel territory infarct. No extra-axial fluid collection. Vascular: No hyperdense vessel. Skull: Prior right frontal burr holes noted. Scalp soft tissues and calvarium otherwise within normal limits. Sinuses/Orbits: Globes and orbital soft tissues within normal limits. Paranasal sinuses and mastoid air cells are largely clear. Layering fluid noted within the posterior nasopharynx. Patient is suspected to be intubated. Other: None. IMPRESSION: 1. Continued interval evolution of right thalamic intraparenchymal hemorrhage, slightly increased in size as compared to previous, estimated volume 19.5 cc on today's exam. Associated regional mass effect with 8 mm of localized right-to-left midline shift. 2. Similar intraventricular hemorrhage. Interval removal of right frontal EVD with slightly worsened obstructive hydrocephalus. 3. Small volume intraparenchymal hemorrhage and edema along the previous right frontal ventriculostomy tract. Electronically Signed   By: Rise Mu M.D.   On: 05/19/2017 00:51   Ct Head Wo Contrast  Result Date: 05/17/2017 CLINICAL DATA:  Followup intracranial hemorrhage. EXAM: CT HEAD WITHOUT CONTRAST TECHNIQUE: Contiguous axial images were obtained from the base of the skull through the vertex without intravenous contrast. COMPARISON:  CT HEAD May 16, 2017 FINDINGS: BRAIN: RIGHT ventriculostomy catheter distal tip traversing the RIGHT frontal horn of the lateral ventricle terminating near the circle of Willis. Stable mild hydrocephalus. Small amount of subarachnoid blood products and trace extra-axial pneumocephalus along catheter tract. Similar volume intraventricular blood products. Evolving large RIGHT thalamus intraparenchymal hematoma with  regional mass effect and vasogenic edema. 7 mm RIGHT to LEFT midline shift, relatively unchanged. Old suspected RIGHT basal ganglia infarct. Old small LEFT cerebellar infarct. Confluent supratentorial white matter hypodensities are similar. No acute large vascular territory infarcts. Basal cisterns remain patent. VASCULAR: Unremarkable. SKULL/SOFT TISSUES: No skull fracture. Three RIGHT frontal burr holes. Mild RIGHT frontal scalp soft tissue swelling with subcutaneous gas and overlying skin staple. ORBITS/SINUSES: The included ocular globes and orbital contents are normal.The mastoid aircells and included paranasal sinuses are well-aerated. OTHER: None. IMPRESSION: 1. Stable position of RIGHT EVD with stable mild hydrocephalus and intraventricular hemorrhage. 2. Evolving RIGHT thalamic hematoma with regional mass effect and 7 mm RIGHT to LEFT midline shift. Electronically Signed   By: Awilda Metro M.D.   On: 05/17/2017 05:12   Ct Head Wo Contrast  Result Date: 05/16/2017 CLINICAL DATA:  52 year old female who presented yesterday with acute right deep gray matter nuclei hemorrhage and intraventricular extension. EXAM: CT HEAD WITHOUT CONTRAST TECHNIQUE: Contiguous axial images were obtained from the base of the skull through the vertex without intravenous contrast. COMPARISON:  05/17/2017 head CTs. FINDINGS: Brain: A right superior approach external ventricular drain has been placed and courses through the anterior right lateral ventricle between the frontal horn and foramen of Monro. The drain terminates  at the right inferior frontal gyrus. There is trace gas and subarachnoid blood along the course of the catheter. Trace additional pneumocephalus over the right superior convexity. Ventricle size appears stable since yesterday. A moderate volume of intraventricular hemorrhage is stable. Estimated intra-axial hemorrhage size centered at the right deep gray matter nuclei is 32 x 40 x 37 millimeters (AP by  transverse by CC), for an estimated blood volume of 24 mL which is stable since the presentation CT yesterday. Leftward midline shift is stable at 8 millimeters. Basilar cistern patency is stable since yesterday. Stable gray-white matter differentiation elsewhere throughout the brain. Vascular: No suspicious intracranial vascular hyperdensity. Skull: Several small craniotomy holes along the right superior frontal bone now. Elsewhere the skull remains intact. Sinuses/Orbits: Remain clear. Other: Visualized orbit soft tissues are within normal limits. Mild postoperative changes to the right vertex scalp soft tissues. IMPRESSION: 1. Right superior frontal approach EVD placed, coursing through the anterior portion of the right lateral ventricle. Trace subarachnoid hemorrhage along the course of the catheter. Trace pneumocephalus. 2. Stable ventricle size and moderate volume intraventricular hemorrhage since 2218 hours yesterday. 3. Estimated intra-axial blood volume centered at the right deep gray matter is stable since the presentation CT yesterday. Surrounding edema and regional mass effect including leftward midline shift of 8 millimeters remain stable. Electronically Signed   By: Odessa Fleming M.D.   On: 05/16/2017 09:35   Ct Head Wo Contrast  Result Date: 05-19-2017 CLINICAL DATA:  Follow-up stroke. EXAM: CT HEAD WITHOUT CONTRAST TECHNIQUE: Contiguous axial images were obtained from the base of the skull through the vertex without intravenous contrast. COMPARISON:  CT HEAD 05/19/17 at 1350 hours FINDINGS: BRAIN: 3.1 x 3.3 x 4 cm similar RIGHT thalamus intraparenchymal hemorrhage with surrounding low-density vasogenic edema. Intraventricular extension again noted with 7 mm stable RIGHT to LEFT subfalcine herniation. Slightly increasing hydrocephalus. Confluent supratentorial white matter hypodensities exclusive a aforementioned abnormality. Old small LEFT cerebellar infarct. Old suspected RIGHT basal ganglia  infarct. No abnormal extra-axial fluid collections. Basal cisterns are patent. VASCULAR: Unremarkable. SKULL/SOFT TISSUES: No skull fracture. No significant soft tissue swelling. ORBITS/SINUSES: The included ocular globes and orbital contents are normal.Trace paranasal sinus mucosal thickening. Mastoid air cells are well aerated. OTHER: None. IMPRESSION: 1. Evolving acute RIGHT thalamus intraparenchymal hematoma with similar intraventricular extension. Similar 7 mm subfalcine herniation. 2. Slightly worsening mild hydrocephalus. Electronically Signed   By: Awilda Metro M.D.   On: 05-19-17 22:30   Ct Head Wo Contrast  Result Date: May 19, 2017 CLINICAL DATA:  Follow up cerebral hemorrhage. EXAM: CT HEAD WITHOUT CONTRAST TECHNIQUE: Contiguous axial images were obtained from the base of the skull through the vertex without intravenous contrast. COMPARISON:  Head CT from earlier same day. FINDINGS: Brain: The acute parenchymal hemorrhage centered in the RIGHT basal ganglia region is not significantly changed compared to the study performed earlier today, measuring 4 cm transverse dimension by 3.7 cm craniocaudal dimension (coronal series 5, image 41). The surrounding edema is also not significantly changed with associated mass effect and midline shift measuring 7 mm. Again noted is moderate to large amount of acute intraventricular hemorrhage, again most prominently seen within the RIGHT lateral ventricle, also seen within the third ventricle and fourth ventricle, all of which appears stable. Trace subarachnoid hemorrhage noted within the LEFT sylvian fissure, stable although better seen on the present exam. Mild ventricular prominence is stable. No evidence of transtentorial or tonsillar herniation. Confluent areas of low density are again seen throughout the  bilateral periventricular and subcortical white matter regions consistent with chronic small vessel ischemic changes. Small old infarct again noted  within the LEFT cerebellum with associated encephalomalacia. Vascular: No hyperdense vessel or unexpected calcification. Skull: Normal. Negative for fracture or focal lesion. Sinuses/Orbits: No acute finding. Other: None. IMPRESSION: 1. Acute intracranial hemorrhage, as detailed above, including the dominant parenchymal hemorrhage centered in the RIGHT basal ganglia which is stable in size and extent compared to the CT performed earlier today. Associated mass effect appears stable with 7 mm leftward midline shift. 2. Moderate amount of intraventricular hemorrhage appears stable. Associated mild ventricular dilatation. 3. Chronic small vessel ischemic changes within the white matter. Electronically Signed   By: Bary Richard M.D.   On: 05/10/2017 14:24   Dg Chest Port 1 View  Result Date: 05/20/2017 CLINICAL DATA:  Intubated EXAM: PORTABLE CHEST 1 VIEW COMPARISON:  05/19/2017 FINDINGS: Support devices are stable. Airspace disease in the left lower lobe is stable. Right lung clear. Heart is normal size. No visible effusions. IMPRESSION: Continued left lower lobe atelectasis or pneumonia, similar to prior study. Electronically Signed   By: Charlett Nose M.D.   On: 05/20/2017 09:12   Dg Chest Port 1 View  Result Date: 05/19/2017 CLINICAL DATA:  52 year old with fever. EXAM: PORTABLE CHEST 1 VIEW COMPARISON:  Chest radiograph 05/16/2017 FINDINGS: Endotracheal tube is 3.7 cm above the carina. Nasogastric tube extends into the abdomen. PICC line tip is near the superior cavoatrial junction. Increased densities at the left lung base particularly in the retrocardiac space. Fullness in the right hilum. Heart size is within normal limits and stable. Blunting at the left costophrenic angle. IMPRESSION: Increased densities at the left lung base. Findings could be associated with atelectasis but also concerning for a developing pneumonia or aspiration. Cannot exclude a small left pleural effusion. Electronically Signed    By: Richarda Overlie M.D.   On: 05/19/2017 08:45   Dg Chest Port 1 View  Result Date: 05/16/2017 CLINICAL DATA:  Endotracheal and OG tube placement. EXAM: PORTABLE CHEST 1 VIEW COMPARISON:  Radiograph earlier this day. FINDINGS: Endotracheal tube 4.1 cm from the carina. Enteric tube in place with tip and side-port below the diaphragm. Right upper extremity central line with tip in the distal SVC. No pneumothorax. Heart is normal in size. No pulmonary edema, pleural effusion or pneumothorax. IMPRESSION: 1. Endotracheal tube 4.1 cm from the carina. Tip and side port of the enteric tube below the diaphragm. Right upper extremity PICC tip in the distal SVC. 2.  No acute pulmonary process. Electronically Signed   By: Rubye Oaks M.D.   On: 05/16/2017 00:56   Chest Port 1 View  Result Date: 05/03/2017 CLINICAL DATA:  Intracerebral hemorrhage, stroke-like symptoms. Incontinent of urine, vomiting. Seizure like activity. EXAM: PORTABLE CHEST 1 VIEW COMPARISON:  None. FINDINGS: Borderline cardiomegaly. Lungs are clear. No pleural effusion or pneumothorax seen. No acute or suspicious osseous finding. IMPRESSION: No active disease. No evidence of pneumonia, pleural effusion or pulmonary edema. Electronically Signed   By: Bary Richard M.D.   On: 05/14/2017 14:14   Ct Head Code Stroke Wo Contrast  Result Date: 05/17/2017 CLINICAL DATA:  Code stroke. 52 year old female with altered mental status and incontinence. Last seen normal 0800 hours. EXAM: CT HEAD WITHOUT CONTRAST TECHNIQUE: Contiguous axial images were obtained from the base of the skull through the vertex without intravenous contrast. COMPARISON:  None. FINDINGS: Brain: Acute intracranial hemorrhage. There is intra-axial hyperdense hemorrhage centered at the right deep gray  matter nuclei, perhaps originating at the right thalamus with extension into the ventricular system. The intra-axial component of blood encompasses 35 x 40 x 37 millimeters for an  estimated blood volume of 26 milliliters. There is surrounding edema with regional mass effect, including midline shift of 8 millimeters near the site of the hemorrhage. There is a moderate volume of intraventricular extension of blood, present in the right lateral ventricle, the 3rd and 4th ventricles. Questionable mild lateral ventriculomegaly at this time. The temporal horns remain diminutive though. Confluent bilateral cerebral white matter hypodensity including involvement of the deep white matter capsules. There is a small chronic appearing infarct in the left cerebellar hemisphere. No other cortical encephalomalacia identified. Basilar cisterns remain patent. No superimposed acute cortically based infarct. Vascular: No suspicious intracranial vascular hyperdensity. Skull: No acute osseous abnormality identified. Hyperostosis, probably a normal variant. Sinuses/Orbits: Clear. Other: Disconjugate gaze. Otherwise negative orbit and scalp soft tissues. ASPECTS [facility] Stroke Program Early CT Score) Total score (0-10 with 10 being normal): Not applicable, acute hemorrhage. IMPRESSION: 1. Acute intracranial hemorrhage. Right deep gray matter/thalamic intra-axial hematoma estimated at 26 mL. Surrounding edema and regional mass effect including 8 millimeters of leftward midline shift. 2. Moderate volume of intraventricular extension of hemorrhage. Questionable mild subsequent ventriculomegaly. 3. 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 Small chronic left cerebellar infarct. Preliminary results were communicated to Dr. Otelia Limes at 11:47 amon 04/25/2019by text page via the [facility] messaging system. Electronically Signed   By: Odessa Fleming M.D.   On: 05/13/2017 11:48   [place] Ekg Site Rite  Result Date: 05/15/2017 If Site Rite image not attached, placement could not be confirmed due to current cardiac rhythm.    Transthoracic Echocardiogram  - Left ventricle: The cavity size was normal. There was  severeconcentric hypertrophy. Systolic function was vigorous. Theestimated ejection fraction was in the range of 65% to 70%. Therewas dynamic obstruction at restin the mid cavity, with mid-cavityobliteration and a peak gradient of 46 mm Hg. Wall motion wasnormal; there were no regional wall motion abnormalities.Features are consistent with a pseudonormal left ventricularfilling pattern, with concomitant abnormal relaxation andincreased filling pressure (grade 2 diastolic dysfunction).Doppler parameters are consistent with high ventricular fillingpressure.     HISTORY OF PRESENT ILLNESS Kendra Barry is an 52 y.o. female who presents via EMS with left sided weakness. LKN was 0800 when she was seen to be walking in the house and behaving normally. She went to sleep and at 10:45 was noted by her friend to be confused and aphasic. EMS was called and noted left sided weakness, left facial droop, AMS and aphasia. She was incontinent of urine and vomited. She was not following commands. CBG was 150. EMS paged out that she had seizure like activity with enroute to hospital, but on further interview there was smooth, non-jerky flailing of her RUE without myoclonus, twitching, jerking or other seizure-like movements.   CT head revealed a large right basal ganglia acute hemorrhage with intraventricular extension.     HOSPITAL COURSE Kendra Barry is a 52 y.o. female with unknown medical history presenting with left-sided weakness, confusion, left facial droop, nausea and vomiting with urine incontinence. .   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 cocaine use  Resultant intubated, BUE posturing on stimulation  CT head x 2 right large basal ganglia ICH with IVH  Repeat CT head 05/16/17 mild hydrocephalus and slightly increased midline shift to 8 mm  Repeat CT head 05/19/17 mildly  worsening hydrocephalus post EVD removal. Hematoma stable.  2D Echo - EF 65-70%.  No source of embolus   LDL 73  HgbA1c 5.4  VTE prophylaxis -SCDs. Change to Lovenox 40 mg sq daily. Ok to use in ICH pt once stable  Diet NPO time specified  Fall precautions  No antithrombotic prior to admission, now on No antithrombotic  Disposition:  Withdraw and comfort care as per family today  Cerebral edema  Midline shift CT head  Mild hydrocephalus  EVD has removed  Pt now going to withdraw care as per family  Respiratory failure  Intubated for airway protection  CCM on board  Will extubate for comfort care  Obstructive hydrocephalus  Due to IVH involving lateral, third and fourth ventricles  Status post EVD  Little EVD drainage.   Repeat CT mildly increased hydrocephalus.   d/c drain 05/19/17  Hypertensive emergency  BP as high as 177/112  SBP BP goal < 160  on Cleviprex, now off   Labetalol PRN IV  Cocaine abuse  UDS positive for cocaine  Cocaine cessation education will be provided  Other Active Problems  Hyperglycemia   DISCHARGE EXAM Pt deceased.   Marvel Plan, MD PhD Stroke Neurology 05/14/2017 11:06 PM

## 2017-06-02 NOTE — Plan of Care (Signed)
Patient has been extubated and placed on comfort care.  Will continue to comfort family at bedside.  Heloise PurpuraSusan Jahniya Duzan RN

## 2017-06-02 NOTE — Progress Notes (Signed)
Pt extubated using withdrawal guidelines.  RN @ bedside. 

## 2017-06-02 NOTE — Progress Notes (Signed)
STROKE TEAM PROGRESS NOTE   SUBJECTIVE (INTERVAL HISTORY) Remains intubated. Posturing UEs on pain stimulation. Unresponsive. Daughter at bedside. She also called her aunt. They decided it was time for withdraw care and comfort care now. Ordered written. CCM notified.    OBJECTIVE Temp:  [96.2 F (35.7 C)-99.3 F (37.4 C)] 96.2 F (35.7 C) (04/20 0900) Pulse Rate:  [80-125] 95 (04/20 0900) Cardiac Rhythm: Normal sinus rhythm (04/20 0400) Resp:  [18-33] 27 (04/20 0900) BP: (127-191)/(71-122) 166/83 (04/20 0900) SpO2:  [96 %-100 %] 100 % (04/20 0900) FiO2 (%):  [40 %] 40 % (04/20 0824) Weight:  [151 lb 10.8 oz (68.8 kg)] 151 lb 10.8 oz (68.8 kg) (04/20 0353)  CBC:  Recent Labs  Lab 05/20/17 0500 05/21/17 0327  WBC 12.4* 9.7  NEUTROABS 9.8* 7.1  HGB 12.5 12.7  HCT 39.1 40.1  MCV 91.4 92.0  PLT 134* 169    Basic Metabolic Panel:  Recent Labs  Lab 05/20/17 0500 05/21/17 0327 Jun 05, 2017 0352  NA 152* 148* 147*  K 3.8 3.7  --   CL 119* 115*  --   CO2 24 25  --   GLUCOSE 168* 162*  --   BUN 18 16  --   CREATININE 0.74 0.57  --   CALCIUM 8.8* 8.8*  --   MG 2.2 2.3  --   PHOS 2.9 3.2  --     Lipid Panel:     Component Value Date/Time   CHOL 149 05/17/2017 0430   TRIG 78 06-05-17 0352   HDL 42 05/17/2017 0430   CHOLHDL 3.5 05/17/2017 0430   VLDL 34 05/17/2017 0430   LDLCALC 73 05/17/2017 0430   HgbA1c:  Lab Results  Component Value Date   HGBA1C 5.4 05/03/2017   Urine Drug Screen:     Component Value Date/Time   LABOPIA NONE DETECTED 05/08/2017 1302   COCAINSCRNUR POSITIVE (A) 05/07/2017 1302   LABBENZ NONE DETECTED 05/05/2017 1302   AMPHETMU NONE DETECTED 05/13/2017 1302   THCU NONE DETECTED 05/09/2017 1302   LABBARB NONE DETECTED 05/09/2017 1302    Alcohol Level     Component Value Date/Time   ETH <10 05/31/2017 1235    IMAGING Ct Head Wo Contrast  Result Date: 05/19/2017 CLINICAL DATA:  Follow-up examination for acute stroke. EXAM: CT HEAD  WITHOUT CONTRAST TECHNIQUE: Contiguous axial images were obtained from the base of the skull through the vertex without intravenous contrast. COMPARISON:  Prior CT from 05/17/2017. FINDINGS: Brain: Continued interval evolution of acute intraparenchymal hemorrhage centered at the right thalamus, measuring 3.7 x 3.1 x 3.4 cm on today's exam (estimated volume 19.5 cc, previously 17 cc surrounding low-density vasogenic edema with regional mass effect. Localized right-to-left midline shift measuring up to 8 mm. Intraventricular extension with blood seen layering within the lateral, third, and fourth ventricles, similar to previous. There has been interval removal of a right frontal approach ventriculostomy catheter. Obstructive hydrocephalus is slightly worsened from previous. Increased in involving intraparenchymal hemorrhage along the right frontal ventriculostomy tract with localized edema. No other acute intracranial hemorrhage. Small remote left cerebellar infarct noted. Suspected chronic right basal ganglia lacunar infarct noted as well. No acute large vessel territory infarct. No extra-axial fluid collection. Vascular: No hyperdense vessel. Skull: Prior right frontal burr holes noted. Scalp soft tissues and calvarium otherwise within normal limits. Sinuses/Orbits: Globes and orbital soft tissues within normal limits. Paranasal sinuses and mastoid air cells are largely clear. Layering fluid noted within the posterior nasopharynx. Patient is suspected  to be intubated. Other: None. IMPRESSION: 1. Continued interval evolution of right thalamic intraparenchymal hemorrhage, slightly increased in size as compared to previous, estimated volume 19.5 cc on today's exam. Associated regional mass effect with 8 mm of localized right-to-left midline shift. 2. Similar intraventricular hemorrhage. Interval removal of right frontal EVD with slightly worsened obstructive hydrocephalus. 3. Small volume intraparenchymal hemorrhage  and edema along the previous right frontal ventriculostomy tract. Electronically Signed   By: Rise Mu M.D.   On: 05/19/2017 00:51   Ct Head Wo Contrast  Result Date: 05/17/2017 CLINICAL DATA:  Followup intracranial hemorrhage. EXAM: CT HEAD WITHOUT CONTRAST TECHNIQUE: Contiguous axial images were obtained from the base of the skull through the vertex without intravenous contrast. COMPARISON:  CT HEAD May 16, 2017 FINDINGS: BRAIN: RIGHT ventriculostomy catheter distal tip traversing the RIGHT frontal horn of the lateral ventricle terminating near the circle of Willis. Stable mild hydrocephalus. Small amount of subarachnoid blood products and trace extra-axial pneumocephalus along catheter tract. Similar volume intraventricular blood products. Evolving large RIGHT thalamus intraparenchymal hematoma with regional mass effect and vasogenic edema. 7 mm RIGHT to LEFT midline shift, relatively unchanged. Old suspected RIGHT basal ganglia infarct. Old small LEFT cerebellar infarct. Confluent supratentorial white matter hypodensities are similar. No acute large vascular territory infarcts. Basal cisterns remain patent. VASCULAR: Unremarkable. SKULL/SOFT TISSUES: No skull fracture. Three RIGHT frontal burr holes. Mild RIGHT frontal scalp soft tissue swelling with subcutaneous gas and overlying skin staple. ORBITS/SINUSES: The included ocular globes and orbital contents are normal.The mastoid aircells and included paranasal sinuses are well-aerated. OTHER: None. IMPRESSION: 1. Stable position of RIGHT EVD with stable mild hydrocephalus and intraventricular hemorrhage. 2. Evolving RIGHT thalamic hematoma with regional mass effect and 7 mm RIGHT to LEFT midline shift. Electronically Signed   By: Awilda Metro M.D.   On: 05/17/2017 05:12   Ct Head Wo Contrast  Result Date: 05/16/2017 CLINICAL DATA:  52 year old female who presented yesterday with acute right deep gray matter nuclei hemorrhage and  intraventricular extension. EXAM: CT HEAD WITHOUT CONTRAST TECHNIQUE: Contiguous axial images were obtained from the base of the skull through the vertex without intravenous contrast. COMPARISON:  06-05-17 head CTs. FINDINGS: Brain: A right superior approach external ventricular drain has been placed and courses through the anterior right lateral ventricle between the frontal horn and foramen of Monro. The drain terminates at the right inferior frontal gyrus. There is trace gas and subarachnoid blood along the course of the catheter. Trace additional pneumocephalus over the right superior convexity. Ventricle size appears stable since yesterday. A moderate volume of intraventricular hemorrhage is stable. Estimated intra-axial hemorrhage size centered at the right deep gray matter nuclei is 32 x 40 x 37 millimeters (AP by transverse by CC), for an estimated blood volume of 24 mL which is stable since the presentation CT yesterday. Leftward midline shift is stable at 8 millimeters. Basilar cistern patency is stable since yesterday. Stable gray-white matter differentiation elsewhere throughout the brain. Vascular: No suspicious intracranial vascular hyperdensity. Skull: Several small craniotomy holes along the right superior frontal bone now. Elsewhere the skull remains intact. Sinuses/Orbits: Remain clear. Other: Visualized orbit soft tissues are within normal limits. Mild postoperative changes to the right vertex scalp soft tissues. IMPRESSION: 1. Right superior frontal approach EVD placed, coursing through the anterior portion of the right lateral ventricle. Trace subarachnoid hemorrhage along the course of the catheter. Trace pneumocephalus. 2. Stable ventricle size and moderate volume intraventricular hemorrhage since 2218 hours yesterday. 3. Estimated intra-axial blood  volume centered at the right deep gray matter is stable since the presentation CT yesterday. Surrounding edema and regional mass effect  including leftward midline shift of 8 millimeters remain stable. Electronically Signed   By: Odessa Fleming M.D.   On: 05/16/2017 09:35   Ct Head Wo Contrast  Result Date: 05-17-2017 CLINICAL DATA:  Follow-up stroke. EXAM: CT HEAD WITHOUT CONTRAST TECHNIQUE: Contiguous axial images were obtained from the base of the skull through the vertex without intravenous contrast. COMPARISON:  CT HEAD 05/17/17 at 1350 hours FINDINGS: BRAIN: 3.1 x 3.3 x 4 cm similar RIGHT thalamus intraparenchymal hemorrhage with surrounding low-density vasogenic edema. Intraventricular extension again noted with 7 mm stable RIGHT to LEFT subfalcine herniation. Slightly increasing hydrocephalus. Confluent supratentorial white matter hypodensities exclusive a aforementioned abnormality. Old small LEFT cerebellar infarct. Old suspected RIGHT basal ganglia infarct. No abnormal extra-axial fluid collections. Basal cisterns are patent. VASCULAR: Unremarkable. SKULL/SOFT TISSUES: No skull fracture. No significant soft tissue swelling. ORBITS/SINUSES: The included ocular globes and orbital contents are normal.Trace paranasal sinus mucosal thickening. Mastoid air cells are well aerated. OTHER: None. IMPRESSION: 1. Evolving acute RIGHT thalamus intraparenchymal hematoma with similar intraventricular extension. Similar 7 mm subfalcine herniation. 2. Slightly worsening mild hydrocephalus. Electronically Signed   By: Awilda Metro M.D.   On: May 17, 2017 22:30   Ct Head Wo Contrast  Result Date: 05-17-2017 CLINICAL DATA:  Follow up cerebral hemorrhage. EXAM: CT HEAD WITHOUT CONTRAST TECHNIQUE: Contiguous axial images were obtained from the base of the skull through the vertex without intravenous contrast. COMPARISON:  Head CT from earlier same day. FINDINGS: Brain: The acute parenchymal hemorrhage centered in the RIGHT basal ganglia region is not significantly changed compared to the study performed earlier today, measuring 4 cm transverse  dimension by 3.7 cm craniocaudal dimension (coronal series 5, image 41). The surrounding edema is also not significantly changed with associated mass effect and midline shift measuring 7 mm. Again noted is moderate to large amount of acute intraventricular hemorrhage, again most prominently seen within the RIGHT lateral ventricle, also seen within the third ventricle and fourth ventricle, all of which appears stable. Trace subarachnoid hemorrhage noted within the LEFT sylvian fissure, stable although better seen on the present exam. Mild ventricular prominence is stable. No evidence of transtentorial or tonsillar herniation. Confluent areas of low density are again seen throughout the bilateral periventricular and subcortical white matter regions consistent with chronic small vessel ischemic changes. Small old infarct again noted within the LEFT cerebellum with associated encephalomalacia. Vascular: No hyperdense vessel or unexpected calcification. Skull: Normal. Negative for fracture or focal lesion. Sinuses/Orbits: No acute finding. Other: None. IMPRESSION: 1. Acute intracranial hemorrhage, as detailed above, including the dominant parenchymal hemorrhage centered in the RIGHT basal ganglia which is stable in size and extent compared to the CT performed earlier today. Associated mass effect appears stable with 7 mm leftward midline shift. 2. Moderate amount of intraventricular hemorrhage appears stable. Associated mild ventricular dilatation. 3. Chronic small vessel ischemic changes within the white matter. Electronically Signed   By: Bary Richard M.D.   On: 05-17-17 14:24   Dg Chest Port 1 View  Result Date: 05/20/2017 CLINICAL DATA:  Intubated EXAM: PORTABLE CHEST 1 VIEW COMPARISON:  05/19/2017 FINDINGS: Support devices are stable. Airspace disease in the left lower lobe is stable. Right lung clear. Heart is normal size. No visible effusions. IMPRESSION: Continued left lower lobe atelectasis or pneumonia,  similar to prior study. Electronically Signed   By: Charlett Nose  M.D.   On: 05/20/2017 09:12   Dg Chest Port 1 View  Result Date: 05/19/2017 CLINICAL DATA:  52 year old with fever. EXAM: PORTABLE CHEST 1 VIEW COMPARISON:  Chest radiograph 05/16/2017 FINDINGS: Endotracheal tube is 3.7 cm above the carina. Nasogastric tube extends into the abdomen. PICC line tip is near the superior cavoatrial junction. Increased densities at the left lung base particularly in the retrocardiac space. Fullness in the right hilum. Heart size is within normal limits and stable. Blunting at the left costophrenic angle. IMPRESSION: Increased densities at the left lung base. Findings could be associated with atelectasis but also concerning for a developing pneumonia or aspiration. Cannot exclude a small left pleural effusion. Electronically Signed   By: Richarda Overlie M.D.   On: 05/19/2017 08:45   Dg Chest Port 1 View  Result Date: 05/16/2017 CLINICAL DATA:  Endotracheal and OG tube placement. EXAM: PORTABLE CHEST 1 VIEW COMPARISON:  Radiograph earlier this day. FINDINGS: Endotracheal tube 4.1 cm from the carina. Enteric tube in place with tip and side-port below the diaphragm. Right upper extremity central line with tip in the distal SVC. No pneumothorax. Heart is normal in size. No pulmonary edema, pleural effusion or pneumothorax. IMPRESSION: 1. Endotracheal tube 4.1 cm from the carina. Tip and side port of the enteric tube below the diaphragm. Right upper extremity PICC tip in the distal SVC. 2.  No acute pulmonary process. Electronically Signed   By: Rubye Oaks M.D.   On: 05/16/2017 00:56   Chest Port 1 View  Result Date: Jun 08, 2017 CLINICAL DATA:  Intracerebral hemorrhage, stroke-like symptoms. Incontinent of urine, vomiting. Seizure like activity. EXAM: PORTABLE CHEST 1 VIEW COMPARISON:  None. FINDINGS: Borderline cardiomegaly. Lungs are clear. No pleural effusion or pneumothorax seen. No acute or suspicious osseous  finding. IMPRESSION: No active disease. No evidence of pneumonia, pleural effusion or pulmonary edema. Electronically Signed   By: Bary Richard M.D.   On: 08-Jun-2017 14:14   Ct Head Code Stroke Wo Contrast  Result Date: 06/08/2017 CLINICAL DATA:  Code stroke. 52 year old female with altered mental status and incontinence. Last seen normal 0800 hours. EXAM: CT HEAD WITHOUT CONTRAST TECHNIQUE: Contiguous axial images were obtained from the base of the skull through the vertex without intravenous contrast. COMPARISON:  None. FINDINGS: Brain: Acute intracranial hemorrhage. There is intra-axial hyperdense hemorrhage centered at the right deep gray matter nuclei, perhaps originating at the right thalamus with extension into the ventricular system. The intra-axial component of blood encompasses 35 x 40 x 37 millimeters for an estimated blood volume of 26 milliliters. There is surrounding edema with regional mass effect, including midline shift of 8 millimeters near the site of the hemorrhage. There is a moderate volume of intraventricular extension of blood, present in the right lateral ventricle, the 3rd and 4th ventricles. Questionable mild lateral ventriculomegaly at this time. The temporal horns remain diminutive though. Confluent bilateral cerebral white matter hypodensity including involvement of the deep white matter capsules. There is a small chronic appearing infarct in the left cerebellar hemisphere. No other cortical encephalomalacia identified. Basilar cisterns remain patent. No superimposed acute cortically based infarct. Vascular: No suspicious intracranial vascular hyperdensity. Skull: No acute osseous abnormality identified. Hyperostosis, probably a normal variant. Sinuses/Orbits: Clear. Other: Disconjugate gaze. Otherwise negative orbit and scalp soft tissues. ASPECTS [facility] Stroke Program Early CT Score) Total score (0-10 with 10 being normal): Not applicable, acute hemorrhage. IMPRESSION: 1. Acute  intracranial hemorrhage. Right deep gray matter/thalamic intra-axial hematoma estimated at 26 mL. Surrounding edema and  regional mass effect including 8 millimeters of leftward midline shift. 2. Moderate volume of intraventricular extension of hemorrhage. Questionable mild subsequent ventriculomegaly. 3. 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 Small chronic left cerebellar infarct. Preliminary results were communicated to Dr. Otelia LimesLindzen at 11:47 amon 2019/02/26by text page via the [facility] messaging system. Electronically Signed   By: Odessa FlemingH  Hall M.D.   On: 02019/02/26 11:48   [place] Ekg Site Rite  Result Date: 2017/03/30 If Site Rite image not attached, placement could not be confirmed due to current cardiac rhythm.   Transthoracic Echocardiogram  - Left ventricle: The cavity size was normal. There was severe concentric hypertrophy. Systolic function was vigorous. The estimated ejection fraction was in the range of 65% to 70%. There was dynamic obstruction at restin the mid cavity, with mid-cavity obliteration and a peak gradient of 46 mm Hg. Wall motion was normal; there were no regional wall motion abnormalities. Features are consistent with a pseudonormal left ventricular filling pattern, with concomitant abnormal relaxation and increased filling pressure (grade 2 diastolic dysfunction). Doppler parameters are consistent with high ventricular filling pressure.   PHYSICAL EXAM General - cachectic, well developed, intubated   Cardiovascular - Regular rate and rhythm.  Neuro - intubated, on ventilation, eyes closed, not following commands.  with forced eye opening, eyes with downward gaze to the left, pupil bilateral pinpoint, nonreactive to light, sluggish doll's eyes, not blinking to visual threat bilaterally, not tracking objects, corneal reflex present, positive gag and cough.  On pain stimulation right and left upper extremity posturing, L>R.  On pain stimulation, right greater than left  lower extremity withdraw to pain, but not against gravity.  DTR 1+, Babinski bilaterally positive. Sensation, coordination and gait not tested.  ASSESSMENT/PLAN Ms. Aniceto Bossngela Strahle is a 52 y.o. female with unknown medical history presenting with left-sided weakness, confusion, left facial droop, nausea and vomiting with urine incontinence. .   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 Hematoma stable.  2D Echo - EF 65-70%. No source of embolus   LDL 73  HgbA1c 5.4  VTE prophylaxis -SCDs. Change to Lovenox 40 mg sq daily. Ok to use in ICH pt once stable Diet NPO time specified Fall precautions  No antithrombotic prior to admission, now on No antithrombotic  Ongoing aggressive stroke risk factor management  Therapy recommendations:  pending  Disposition:  Withdraw and comfort care as per family today  Cerebral edema  Midline shift CT head  Mild hydrocephalus  EVD has removed  Pt now going to withdraw care as per family  Respiratory failure  Intubated for airway protection  CCM on board  Will extubate for comfort care  Obstructive hydrocephalus  Due to IVH involving lateral, third and fourth ventricles  Status post EVD  Little EVD drainage.   Repeat CT mildly increased hydrocephalus.   d/c drain 05/19/17  Hypertensive emergency  BP as high as 177/112 . SBP BP goal < 160 . on Cleviprex, now off  . Labetalol PRN IV . on lisinopril 20 mg bid and labetalol 100 tid  Cocaine abuse  UDS positive for cocaine  Cocaine cessation education will be provided  Other Active Problems  Hyperglycemia  Hospital day #  7  Marvel Plan, MD PhD Stroke  Neurology 05-30-17 10:10 AM   To contact Stroke Continuity provider, please refer to [email or web address]. After hours, contact General Neurology

## 2017-06-02 DEATH — deceased

## 2019-12-18 IMAGING — CT CT HEAD W/O CM
4 series · 15 of 47 positions shown, 17 images · non-contrast
Comparison: CT HEAD May 16, 2017

CLINICAL DATA: Followup intracranial hemorrhage.

EXAM:
CT HEAD WITHOUT CONTRAST
TECHNIQUE: Contiguous axial images were obtained from the base of the skull
through the vertex without intravenous contrast.

[Series 3: head without · axial · non-contrast · 0.43mm/px · z∈[-232,-112]mm · 7 of 32 slices shown, 9 images]
[im 4/32  brain]
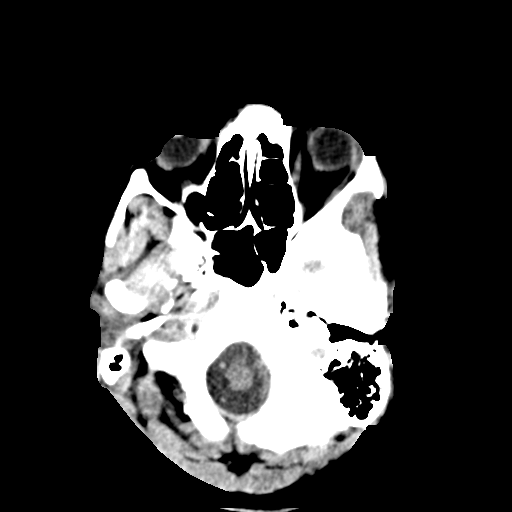
[im 4/32  bone]
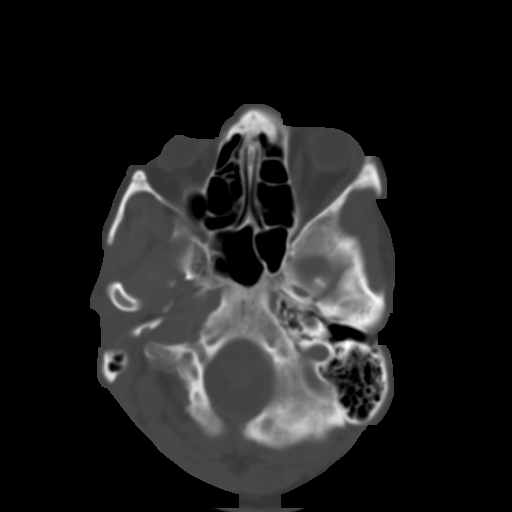
[im 8/32  brain]
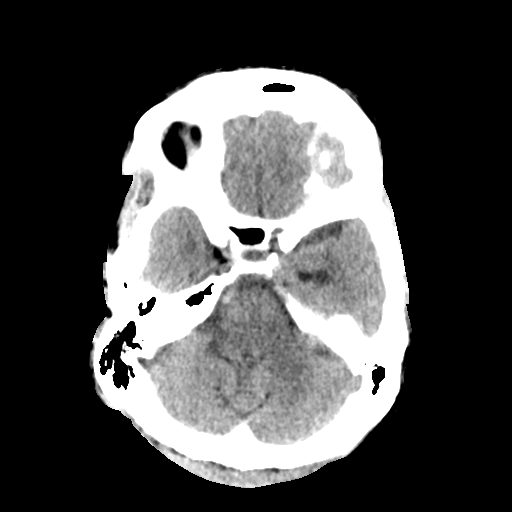
[im 12/32  brain]
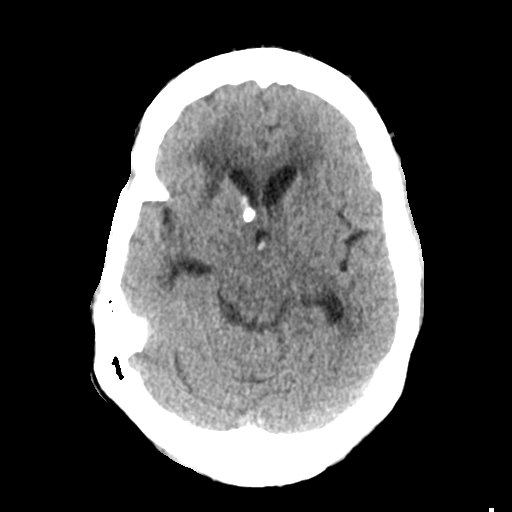
[im 16/32  brain]
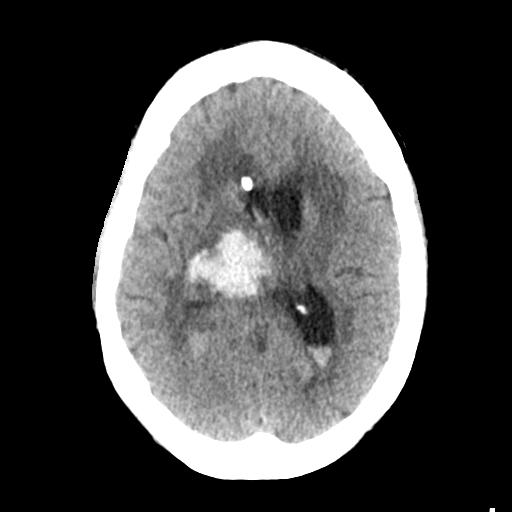
[im 20/32  brain]
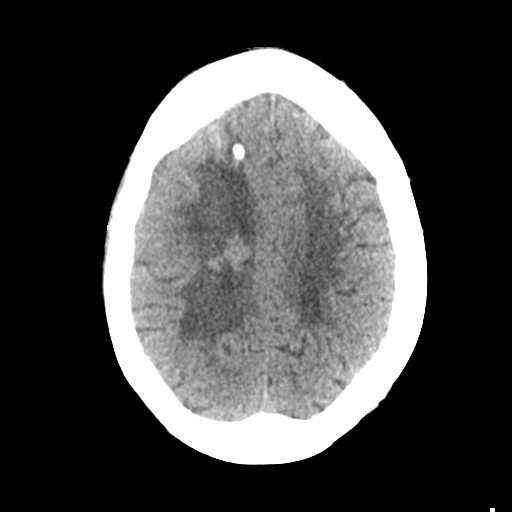
[im 20/32  bone]
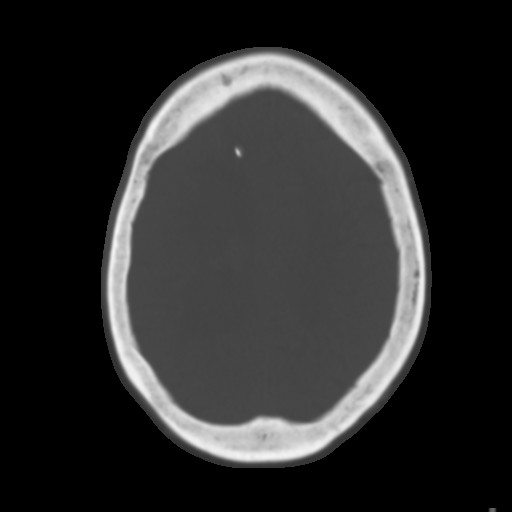
[im 24/32  brain]
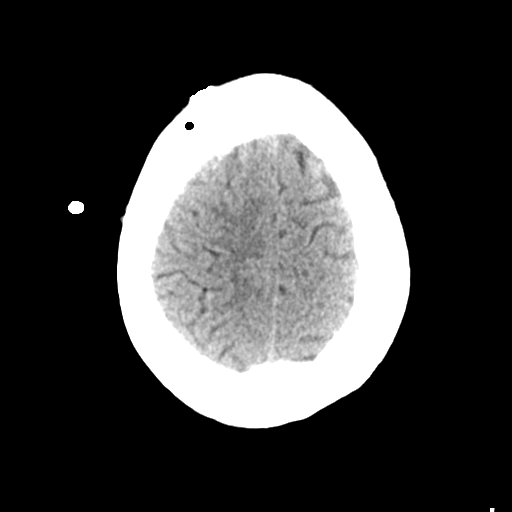
[im 28/32  brain]
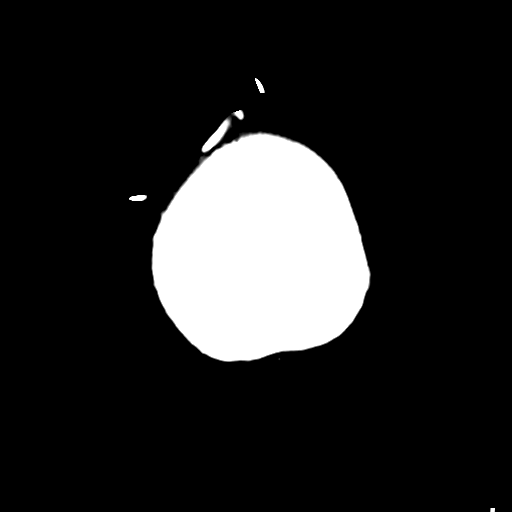

[Series 4: head bone · axial · 0.43mm/px · z∈[-233,-217]mm · 2 of 80 slices shown]
[im 8/80  bone]
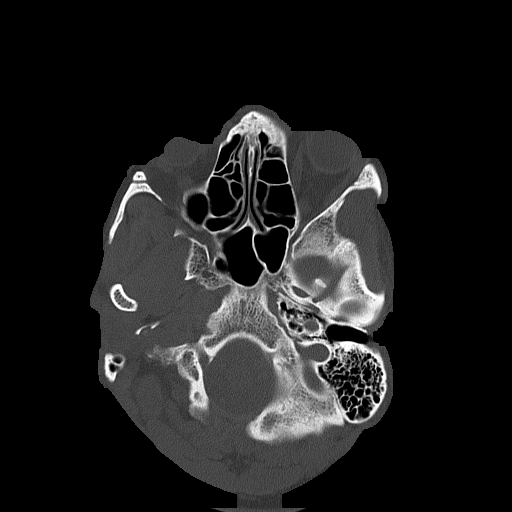
[im 16/80  bone]
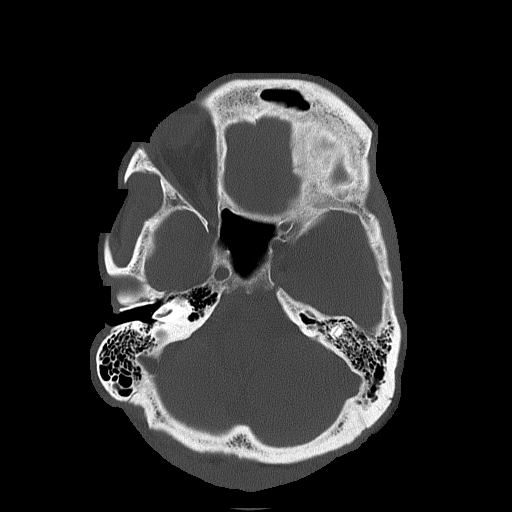

[Series 5: head without cor · coronal · non-contrast · 0.32mm/px · 3 of 63 slices shown]
[im 21/63  brain]
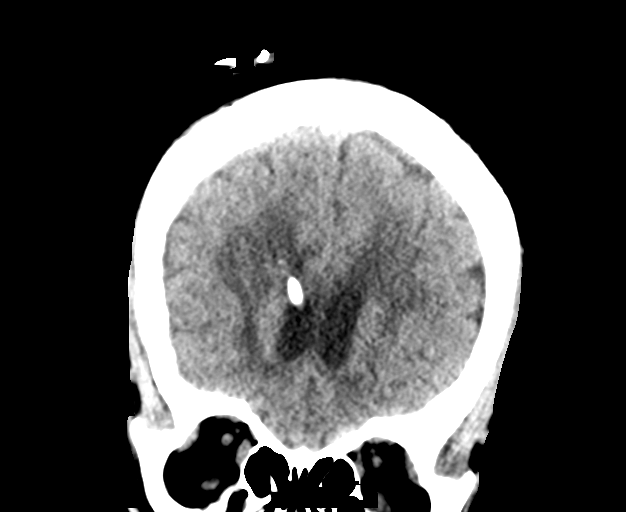
[im 28/63  brain]
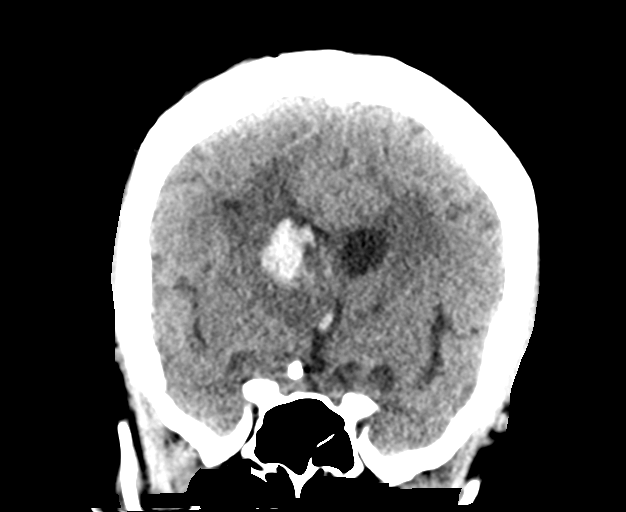
[im 35/63  brain]
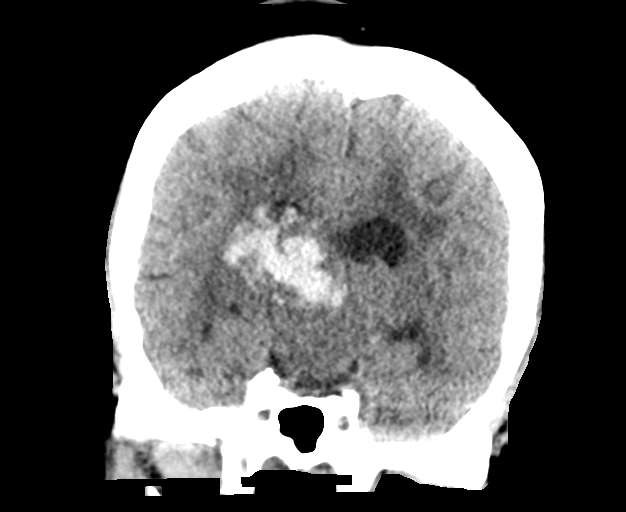

[Series 6: head without sag · sagittal · non-contrast · 0.36mm/px · 3 of 50 slices shown]
[im 17/50  brain]
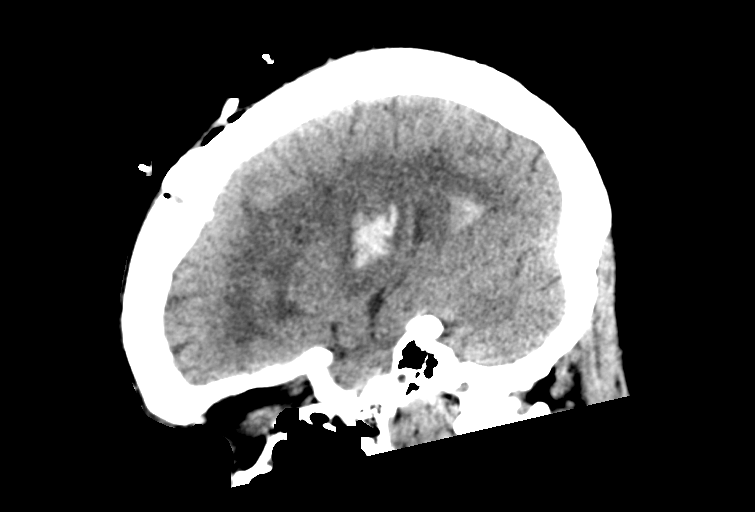
[im 25/50  brain]
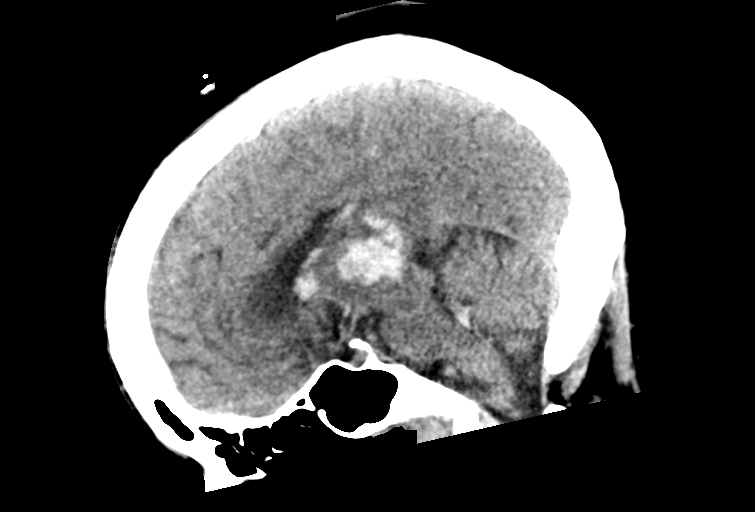
[im 33/50  brain]
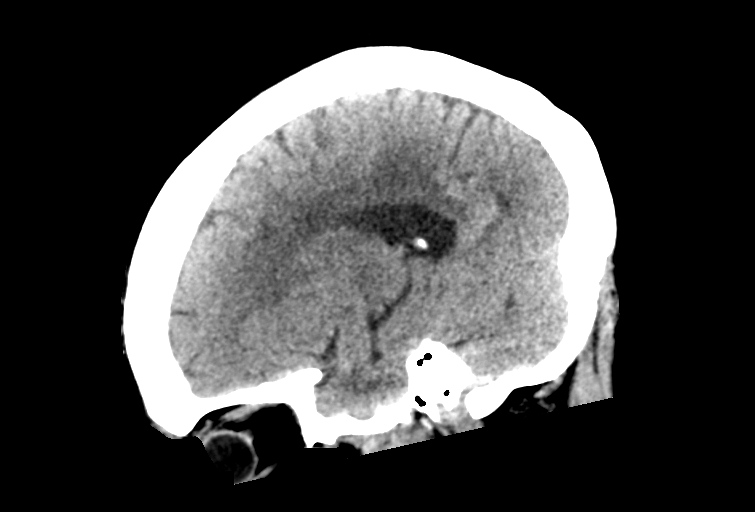

[15 of 47 positions shown; findings below may reference images not displayed]

FINDINGS: BRAIN: RIGHT ventriculostomy catheter distal tip traversing the
RIGHT frontal horn of the lateral ventricle terminating near the
circle of Willis. Stable mild hydrocephalus. Small amount of
subarachnoid blood products and trace extra-axial pneumocephalus
along catheter tract. Similar volume intraventricular blood
products. Evolving large RIGHT thalamus intraparenchymal hematoma
with regional mass effect and vasogenic edema. 7 mm RIGHT to LEFT
midline shift, relatively unchanged. Old suspected RIGHT basal
ganglia infarct. Old small LEFT cerebellar infarct. Confluent
supratentorial white matter hypodensities are similar. No acute
large vascular territory infarcts. Basal cisterns remain patent.

VASCULAR: Unremarkable.

SKULL/SOFT TISSUES: No skull fracture. Three RIGHT frontal burr
holes. Mild RIGHT frontal scalp soft tissue swelling with
subcutaneous gas and overlying skin staple.

ORBITS/SINUSES: The included ocular globes and orbital contents are
normal.The mastoid aircells and included paranasal sinuses are
well-aerated.

OTHER: None.
IMPRESSION: 1. Stable position of RIGHT EVD with stable mild hydrocephalus and
intraventricular hemorrhage.
2. Evolving RIGHT thalamic hematoma with regional mass effect and 7
mm RIGHT to LEFT midline shift.

## 2019-12-20 IMAGING — CR DG CHEST 1V PORT
1 series · 1 of 1 positions shown · non-contrast
Comparison: Chest radiograph 05/16/2017

CLINICAL DATA: 51-year-old with fever.

EXAM:
PORTABLE CHEST 1 VIEW

[AP]
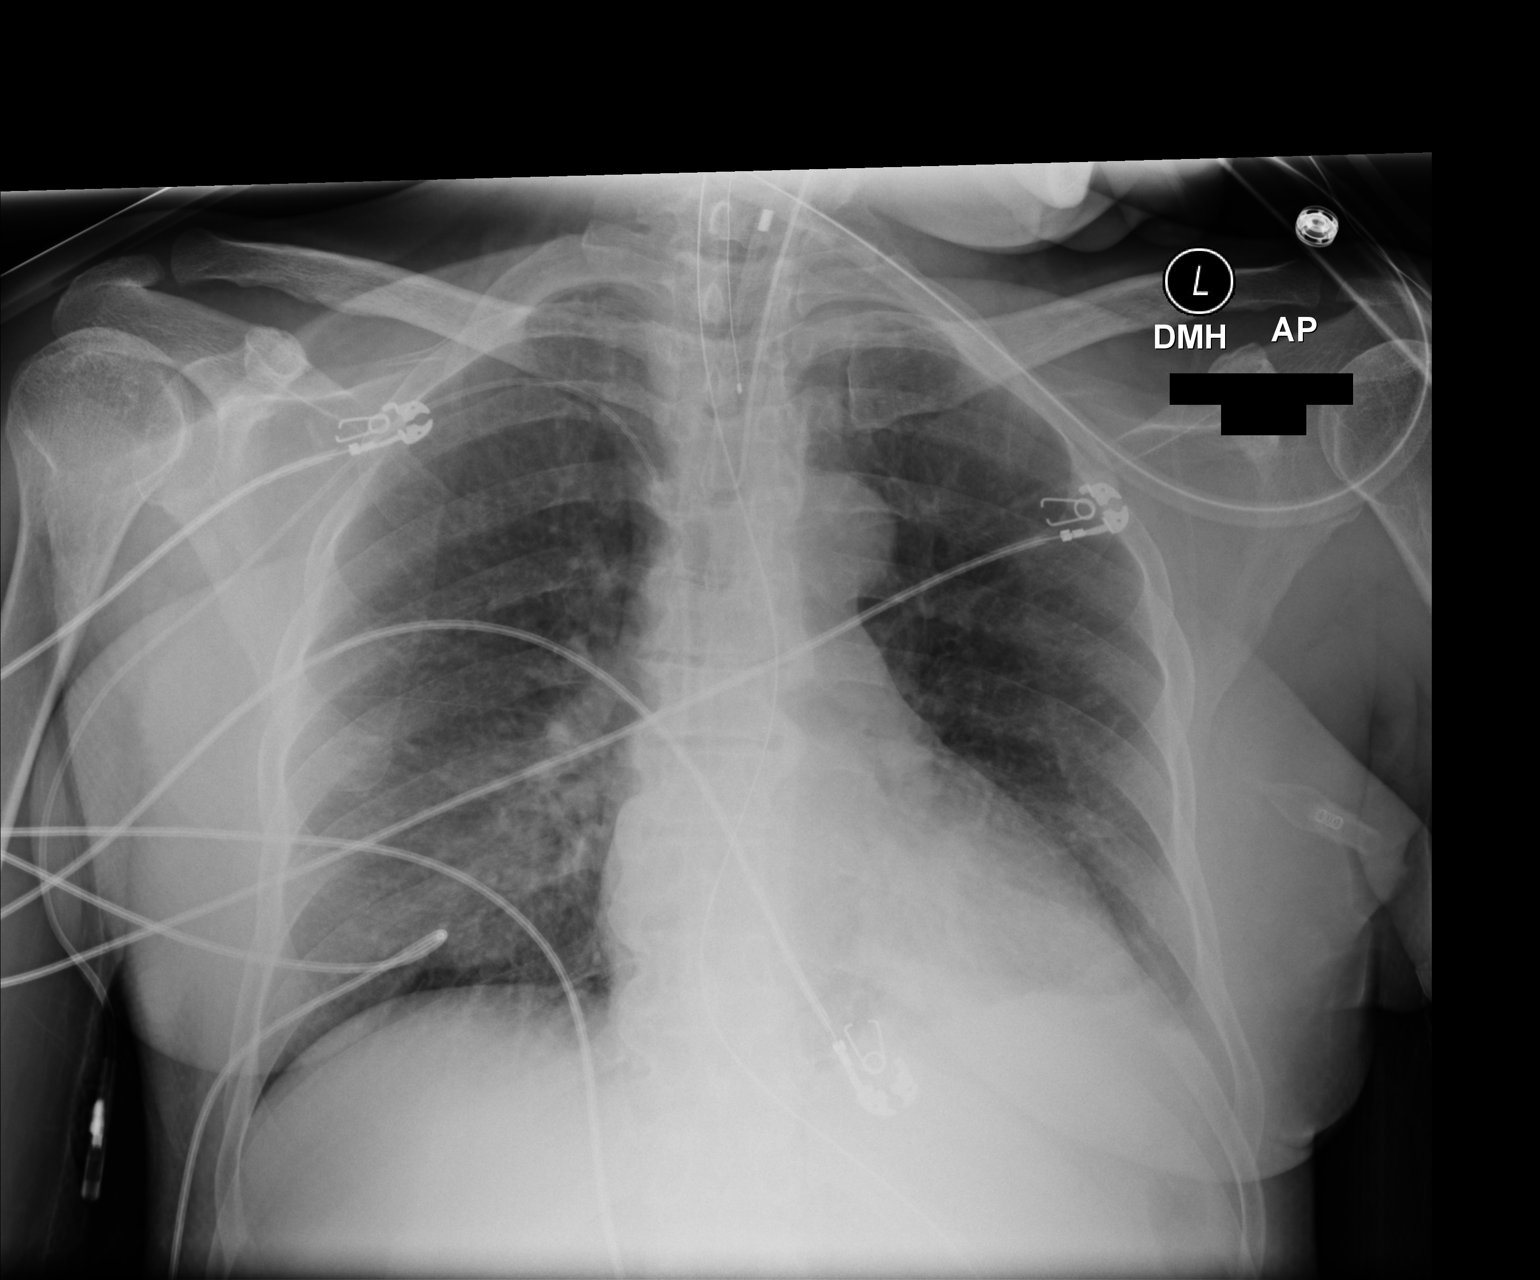

[1 of 1 positions shown; findings below may reference images not displayed]

FINDINGS: Endotracheal tube is 3.7 cm above the carina. Nasogastric tube
extends into the abdomen. PICC line tip is near the superior
cavoatrial junction. Increased densities at the left lung base
particularly in the retrocardiac space. Fullness in the right hilum.
Heart size is within normal limits and stable. Blunting at the left
costophrenic angle.
IMPRESSION: Increased densities at the left lung base. Findings could be
associated with atelectasis but also concerning for a developing
pneumonia or aspiration. Cannot exclude a small left pleural
effusion.
